# Patient Record
Sex: Male | Born: 1945 | Race: White | Hispanic: No | Marital: Married | Smoking: Never smoker
Health system: Southern US, Community
[De-identification: ages and names within clinical notes are randomized; demographics above are authoritative.]

## PROBLEM LIST (undated history)

## (undated) DIAGNOSIS — N189 Chronic kidney disease, unspecified: Secondary | ICD-10-CM

## (undated) DIAGNOSIS — M1990 Unspecified osteoarthritis, unspecified site: Secondary | ICD-10-CM

## (undated) DIAGNOSIS — E119 Type 2 diabetes mellitus without complications: Secondary | ICD-10-CM

## (undated) DIAGNOSIS — I10 Essential (primary) hypertension: Secondary | ICD-10-CM

## (undated) HISTORY — PX: COLONOSCOPY: SHX174

## (undated) HISTORY — PX: CATARACT EXTRACTION, BILATERAL: SHX1313

## (undated) HISTORY — DX: Essential (primary) hypertension: I10

## (undated) HISTORY — PX: APPENDECTOMY: SHX54

---

## 2013-06-08 DIAGNOSIS — I10 Essential (primary) hypertension: Secondary | ICD-10-CM | POA: Diagnosis not present

## 2013-06-08 DIAGNOSIS — Z1331 Encounter for screening for depression: Secondary | ICD-10-CM | POA: Diagnosis not present

## 2013-06-08 DIAGNOSIS — E785 Hyperlipidemia, unspecified: Secondary | ICD-10-CM | POA: Diagnosis not present

## 2013-06-08 DIAGNOSIS — M25569 Pain in unspecified knee: Secondary | ICD-10-CM | POA: Diagnosis not present

## 2013-06-08 DIAGNOSIS — Z6833 Body mass index (BMI) 33.0-33.9, adult: Secondary | ICD-10-CM | POA: Diagnosis not present

## 2013-08-18 DIAGNOSIS — I10 Essential (primary) hypertension: Secondary | ICD-10-CM | POA: Diagnosis not present

## 2013-08-18 DIAGNOSIS — Z125 Encounter for screening for malignant neoplasm of prostate: Secondary | ICD-10-CM | POA: Diagnosis not present

## 2013-08-18 DIAGNOSIS — E785 Hyperlipidemia, unspecified: Secondary | ICD-10-CM | POA: Diagnosis not present

## 2013-08-25 DIAGNOSIS — Z23 Encounter for immunization: Secondary | ICD-10-CM | POA: Diagnosis not present

## 2013-08-25 DIAGNOSIS — IMO0002 Reserved for concepts with insufficient information to code with codable children: Secondary | ICD-10-CM | POA: Diagnosis not present

## 2013-08-25 DIAGNOSIS — Z6834 Body mass index (BMI) 34.0-34.9, adult: Secondary | ICD-10-CM | POA: Diagnosis not present

## 2013-08-25 DIAGNOSIS — E785 Hyperlipidemia, unspecified: Secondary | ICD-10-CM | POA: Diagnosis not present

## 2013-08-25 DIAGNOSIS — Z0000 Encounter for general adult medical examination without abnormal findings: Secondary | ICD-10-CM | POA: Diagnosis not present

## 2013-08-25 DIAGNOSIS — I10 Essential (primary) hypertension: Secondary | ICD-10-CM | POA: Diagnosis not present

## 2013-08-25 DIAGNOSIS — Z125 Encounter for screening for malignant neoplasm of prostate: Secondary | ICD-10-CM | POA: Diagnosis not present

## 2013-08-29 DIAGNOSIS — Z1212 Encounter for screening for malignant neoplasm of rectum: Secondary | ICD-10-CM | POA: Diagnosis not present

## 2014-08-23 DIAGNOSIS — I10 Essential (primary) hypertension: Secondary | ICD-10-CM | POA: Diagnosis not present

## 2014-08-23 DIAGNOSIS — Z125 Encounter for screening for malignant neoplasm of prostate: Secondary | ICD-10-CM | POA: Diagnosis not present

## 2014-08-23 DIAGNOSIS — E785 Hyperlipidemia, unspecified: Secondary | ICD-10-CM | POA: Diagnosis not present

## 2014-08-30 DIAGNOSIS — L989 Disorder of the skin and subcutaneous tissue, unspecified: Secondary | ICD-10-CM | POA: Diagnosis not present

## 2014-08-30 DIAGNOSIS — E785 Hyperlipidemia, unspecified: Secondary | ICD-10-CM | POA: Diagnosis not present

## 2014-08-30 DIAGNOSIS — Z1212 Encounter for screening for malignant neoplasm of rectum: Secondary | ICD-10-CM | POA: Diagnosis not present

## 2014-08-30 DIAGNOSIS — I10 Essential (primary) hypertension: Secondary | ICD-10-CM | POA: Diagnosis not present

## 2014-08-30 DIAGNOSIS — Z23 Encounter for immunization: Secondary | ICD-10-CM | POA: Diagnosis not present

## 2014-08-30 DIAGNOSIS — Z0000 Encounter for general adult medical examination without abnormal findings: Secondary | ICD-10-CM | POA: Diagnosis not present

## 2014-08-30 DIAGNOSIS — Z6834 Body mass index (BMI) 34.0-34.9, adult: Secondary | ICD-10-CM | POA: Diagnosis not present

## 2014-09-01 ENCOUNTER — Ambulatory Visit (AMBULATORY_SURGERY_CENTER): Payer: Self-pay

## 2014-09-01 VITALS — Ht 69.0 in | Wt 234.6 lb

## 2014-09-01 DIAGNOSIS — Z1211 Encounter for screening for malignant neoplasm of colon: Secondary | ICD-10-CM

## 2014-09-01 MED ORDER — MOVIPREP 100 G PO SOLR: 1.0000 | Freq: Once | ORAL | Status: DC

## 2014-09-01 NOTE — Progress Notes (Signed)
No allergies to eggs or soy No diet/weight loss meds No home oxygen No past problems with anesthesia  Has email  Emmi instructions given for colonoscopy 

## 2014-09-13 ENCOUNTER — Encounter: Payer: Self-pay | Admitting: Internal Medicine

## 2014-09-25 ENCOUNTER — Ambulatory Visit (AMBULATORY_SURGERY_CENTER): Payer: Medicare Other | Admitting: Internal Medicine

## 2014-09-25 ENCOUNTER — Encounter: Payer: Self-pay | Admitting: Internal Medicine

## 2014-09-25 VITALS — BP 137/81 | HR 79 | Temp 97.2°F | Resp 19 | Ht 69.0 in | Wt 234.0 lb

## 2014-09-25 DIAGNOSIS — Z1211 Encounter for screening for malignant neoplasm of colon: Secondary | ICD-10-CM

## 2014-09-25 MED ORDER — SODIUM CHLORIDE 0.9 % IV SOLN: 500.0000 mL | INTRAVENOUS | Status: DC

## 2014-09-25 NOTE — Patient Instructions (Signed)
Discharge instructions given. Normal exam. Resume previous medications. YOU HAD AN ENDOSCOPIC PROCEDURE TODAY AT THE [facility]:   Refer to the procedure report that was given to you for any specific questions about what was found during the examination.  If the procedure report does not answer your questions, please call your gastroenterologist to clarify.  If you requested that your care partner not be given the details of your procedure findings, then the procedure report has been included in a sealed envelope for you to review at your convenience later.  YOU SHOULD EXPECT: Some feelings of bloating in the abdomen. Passage of more gas than usual.  Walking can help get rid of the air that was put into your GI tract during the procedure and reduce the bloating. If you had a lower endoscopy (such as a colonoscopy or flexible sigmoidoscopy) you may notice spotting of blood in your stool or on the toilet paper. If you underwent a bowel prep for your procedure, you may not have a normal bowel movement for a few days.  Please Note:  You might notice some irritation and congestion in your nose or some drainage.  This is from the oxygen used during your procedure.  There is no need for concern and it should clear up in a day or so.  SYMPTOMS TO REPORT IMMEDIATELY:   Following lower endoscopy (colonoscopy or flexible sigmoidoscopy):  Excessive amounts of blood in the stool  Significant tenderness or worsening of abdominal pains  Swelling of the abdomen that is new, acute  Fever of 100F or higher   For urgent or emergent issues, a gastroenterologist can be reached at any hour by calling [phone].   DIET: Your first meal following the procedure should be a small meal and then it is ok to progress to your normal diet. Heavy or fried foods are harder to digest and may make you feel nauseous or bloated.  Likewise, meals heavy in dairy and vegetables can increase bloating.  Drink plenty  of fluids but you should avoid alcoholic beverages for 24 hours.  ACTIVITY:  You should plan to take it easy for the rest of today and you should NOT DRIVE or use heavy machinery until tomorrow (because of the sedation medicines used during the test).    FOLLOW UP: Our staff will call the number listed on your records the next business day following your procedure to check on you and address any questions or concerns that you may have regarding the information given to you following your procedure. If we do not reach you, we will leave a message.  However, if you are feeling well and you are not experiencing any problems, there is no need to return our call.  We will assume that you have returned to your regular daily activities without incident.  If any biopsies were taken you will be contacted by phone or by letter within the next 1-3 weeks.  Please call us at [phone] if you have not heard about the biopsies in 3 weeks.    SIGNATURES/CONFIDENTIALITY: You and/or your care partner have signed paperwork which will be entered into your electronic medical record.  These signatures attest to the fact that that the information above on your After Visit Summary has been reviewed and is understood.  Full responsibility of the confidentiality of this discharge information lies with you and/or your care-partner. 

## 2014-09-25 NOTE — Op Note (Signed)
[facility]  [address] [place], [address]   COLONOSCOPY PROCEDURE REPORT  PATIENT: Jonathan Jimenez, Jonathan Jimenez  MR#: [id number] BIRTHDATE: [birth year] , 48  yrs. old GENDER: male ENDOSCOPIST: Eustace Quail, MD REFERRED XE:NMMHW Ardeth Perfect, M.D. PROCEDURE DATE:  09/25/2014 PROCEDURE:   Colonoscopy, screening First Screening Colonoscopy - Avg.  risk and is 50 yrs.  old or older Yes.  Prior Negative Screening - Now for repeat screening. N/A  History of Adenoma - Now for follow-up colonoscopy & has been > or = to 3 yrs.  N/A ASA CLASS:   Class II INDICATIONS:Screening for colonic neoplasia and Colorectal Neoplasm Risk Assessment for this procedure is average risk. MEDICATIONS: Monitored anesthesia care, Propofol 200 mg IV, and Lidocaine 200 mg IV  DESCRIPTION OF PROCEDURE:   After the risks benefits and alternatives of the procedure were thoroughly explained, informed consent was obtained.  The digital rectal exam revealed no abnormalities of the rectum.   The LB [id number]  endoscope was introduced through the anus and advanced to the cecum, which was identified by both the appendix and ileocecal valve. No adverse events experienced.   The quality of the prep was excellent. (MoviPrep was used)  The instrument was then slowly withdrawn as the colon was fully examined.      COLON FINDINGS: A normal appearing cecum, ileocecal valve, and appendiceal orifice were identified.  The ascending, transverse, descending, sigmoid colon, and rectum appeared unremarkable. Retroflexed views revealed internal hemorrhoids. The time to cecum = 2.5 Withdrawal time = 7.6   The scope was withdrawn and the procedure completed.  COMPLICATIONS: There were no immediate complications.  ENDOSCOPIC IMPRESSION: Normal colonoscopy  RECOMMENDATIONS: Continue current colorectal screening recommendations for "routine risk" patients with a repeat colonoscopy in 10 years.  eSigned:  Eustace Quail, MD 09/25/2014 10:13 AM   cc: The Patient    ; Ermalinda Memos

## 2014-09-25 NOTE — Progress Notes (Signed)
Report to PACU, RN, vss, BBS= Clear.  

## 2014-09-26 ENCOUNTER — Telehealth: Payer: Self-pay | Admitting: *Deleted

## 2014-09-26 NOTE — Telephone Encounter (Signed)
  Follow up Call-  Call back number 09/25/2014  Post procedure Call Back phone  # [phone]  Permission to leave phone message Yes     Patient questions:  Do you have a fever, pain , or abdominal swelling? No. Pain Score  0 *  Have you tolerated food without any problems? Yes.    Have you been able to return to your normal activities? Yes.    Do you have any questions about your discharge instructions: Diet   No. Medications  No. Follow up visit  No.  Do you have questions or concerns about your Care? No.  Actions: * If pain score is 4 or above: No action needed, pain <4.

## 2015-01-02 DIAGNOSIS — H3531 Nonexudative age-related macular degeneration: Secondary | ICD-10-CM | POA: Diagnosis not present

## 2015-08-31 DIAGNOSIS — I10 Essential (primary) hypertension: Secondary | ICD-10-CM | POA: Diagnosis not present

## 2015-08-31 DIAGNOSIS — Z125 Encounter for screening for malignant neoplasm of prostate: Secondary | ICD-10-CM | POA: Diagnosis not present

## 2015-08-31 DIAGNOSIS — E784 Other hyperlipidemia: Secondary | ICD-10-CM | POA: Diagnosis not present

## 2015-09-07 DIAGNOSIS — Z1389 Encounter for screening for other disorder: Secondary | ICD-10-CM | POA: Diagnosis not present

## 2015-09-07 DIAGNOSIS — I10 Essential (primary) hypertension: Secondary | ICD-10-CM | POA: Diagnosis not present

## 2015-09-07 DIAGNOSIS — R509 Fever, unspecified: Secondary | ICD-10-CM | POA: Diagnosis not present

## 2015-09-07 DIAGNOSIS — R972 Elevated prostate specific antigen [PSA]: Secondary | ICD-10-CM | POA: Diagnosis not present

## 2015-09-07 DIAGNOSIS — N183 Chronic kidney disease, stage 3 (moderate): Secondary | ICD-10-CM | POA: Diagnosis not present

## 2015-09-07 DIAGNOSIS — Z0000 Encounter for general adult medical examination without abnormal findings: Secondary | ICD-10-CM | POA: Diagnosis not present

## 2015-09-07 DIAGNOSIS — E784 Other hyperlipidemia: Secondary | ICD-10-CM | POA: Diagnosis not present

## 2015-09-07 DIAGNOSIS — Z6834 Body mass index (BMI) 34.0-34.9, adult: Secondary | ICD-10-CM | POA: Diagnosis not present

## 2015-09-12 DIAGNOSIS — Z1212 Encounter for screening for malignant neoplasm of rectum: Secondary | ICD-10-CM | POA: Diagnosis not present

## 2015-09-19 DIAGNOSIS — I10 Essential (primary) hypertension: Secondary | ICD-10-CM | POA: Diagnosis not present

## 2015-09-19 DIAGNOSIS — N183 Chronic kidney disease, stage 3 (moderate): Secondary | ICD-10-CM | POA: Diagnosis not present

## 2015-09-19 DIAGNOSIS — R9721 Rising PSA following treatment for malignant neoplasm of prostate: Secondary | ICD-10-CM | POA: Diagnosis not present

## 2015-09-19 DIAGNOSIS — L239 Allergic contact dermatitis, unspecified cause: Secondary | ICD-10-CM | POA: Diagnosis not present

## 2015-09-19 DIAGNOSIS — Z6834 Body mass index (BMI) 34.0-34.9, adult: Secondary | ICD-10-CM | POA: Diagnosis not present

## 2015-09-19 DIAGNOSIS — R112 Nausea with vomiting, unspecified: Secondary | ICD-10-CM | POA: Diagnosis not present

## 2015-09-26 DIAGNOSIS — R972 Elevated prostate specific antigen [PSA]: Secondary | ICD-10-CM | POA: Diagnosis not present

## 2015-09-26 DIAGNOSIS — N183 Chronic kidney disease, stage 3 (moderate): Secondary | ICD-10-CM | POA: Diagnosis not present

## 2015-09-26 DIAGNOSIS — I10 Essential (primary) hypertension: Secondary | ICD-10-CM | POA: Diagnosis not present

## 2015-09-26 DIAGNOSIS — Z6834 Body mass index (BMI) 34.0-34.9, adult: Secondary | ICD-10-CM | POA: Diagnosis not present

## 2015-10-01 DIAGNOSIS — R350 Frequency of micturition: Secondary | ICD-10-CM | POA: Diagnosis not present

## 2015-10-01 DIAGNOSIS — R972 Elevated prostate specific antigen [PSA]: Secondary | ICD-10-CM | POA: Diagnosis not present

## 2015-10-01 DIAGNOSIS — Z0000 Encounter for general adult medical examination without abnormal findings: Secondary | ICD-10-CM | POA: Diagnosis not present

## 2015-10-01 DIAGNOSIS — N401 Enlarged prostate with lower urinary tract symptoms: Secondary | ICD-10-CM | POA: Diagnosis not present

## 2015-10-18 DIAGNOSIS — R350 Frequency of micturition: Secondary | ICD-10-CM | POA: Diagnosis not present

## 2015-12-14 DIAGNOSIS — R972 Elevated prostate specific antigen [PSA]: Secondary | ICD-10-CM | POA: Diagnosis not present

## 2016-01-17 DIAGNOSIS — N401 Enlarged prostate with lower urinary tract symptoms: Secondary | ICD-10-CM | POA: Diagnosis not present

## 2016-01-17 DIAGNOSIS — R350 Frequency of micturition: Secondary | ICD-10-CM | POA: Diagnosis not present

## 2016-01-17 DIAGNOSIS — R972 Elevated prostate specific antigen [PSA]: Secondary | ICD-10-CM | POA: Diagnosis not present

## 2016-06-03 DIAGNOSIS — H25013 Cortical age-related cataract, bilateral: Secondary | ICD-10-CM | POA: Diagnosis not present

## 2016-06-03 DIAGNOSIS — H2513 Age-related nuclear cataract, bilateral: Secondary | ICD-10-CM | POA: Diagnosis not present

## 2016-06-03 DIAGNOSIS — H353132 Nonexudative age-related macular degeneration, bilateral, intermediate dry stage: Secondary | ICD-10-CM | POA: Diagnosis not present

## 2016-07-10 DIAGNOSIS — R972 Elevated prostate specific antigen [PSA]: Secondary | ICD-10-CM | POA: Diagnosis not present

## 2016-07-17 DIAGNOSIS — R972 Elevated prostate specific antigen [PSA]: Secondary | ICD-10-CM | POA: Diagnosis not present

## 2016-07-17 DIAGNOSIS — N400 Enlarged prostate without lower urinary tract symptoms: Secondary | ICD-10-CM | POA: Diagnosis not present

## 2016-09-03 DIAGNOSIS — N183 Chronic kidney disease, stage 3 (moderate): Secondary | ICD-10-CM | POA: Diagnosis not present

## 2016-09-03 DIAGNOSIS — R7309 Other abnormal glucose: Secondary | ICD-10-CM | POA: Diagnosis not present

## 2016-09-03 DIAGNOSIS — Z125 Encounter for screening for malignant neoplasm of prostate: Secondary | ICD-10-CM | POA: Diagnosis not present

## 2016-09-03 DIAGNOSIS — E784 Other hyperlipidemia: Secondary | ICD-10-CM | POA: Diagnosis not present

## 2016-09-10 DIAGNOSIS — Z1389 Encounter for screening for other disorder: Secondary | ICD-10-CM | POA: Diagnosis not present

## 2016-09-10 DIAGNOSIS — E784 Other hyperlipidemia: Secondary | ICD-10-CM | POA: Diagnosis not present

## 2016-09-10 DIAGNOSIS — I10 Essential (primary) hypertension: Secondary | ICD-10-CM | POA: Diagnosis not present

## 2016-09-10 DIAGNOSIS — N183 Chronic kidney disease, stage 3 (moderate): Secondary | ICD-10-CM | POA: Diagnosis not present

## 2016-09-10 DIAGNOSIS — R972 Elevated prostate specific antigen [PSA]: Secondary | ICD-10-CM | POA: Diagnosis not present

## 2016-09-10 DIAGNOSIS — Z0000 Encounter for general adult medical examination without abnormal findings: Secondary | ICD-10-CM | POA: Diagnosis not present

## 2016-09-10 DIAGNOSIS — Z6836 Body mass index (BMI) 36.0-36.9, adult: Secondary | ICD-10-CM | POA: Diagnosis not present

## 2016-09-10 DIAGNOSIS — M179 Osteoarthritis of knee, unspecified: Secondary | ICD-10-CM | POA: Diagnosis not present

## 2016-09-11 DIAGNOSIS — Z1212 Encounter for screening for malignant neoplasm of rectum: Secondary | ICD-10-CM | POA: Diagnosis not present

## 2016-10-07 DIAGNOSIS — R3915 Urgency of urination: Secondary | ICD-10-CM | POA: Diagnosis not present

## 2016-10-07 DIAGNOSIS — M13161 Monoarthritis, not elsewhere classified, right knee: Secondary | ICD-10-CM | POA: Diagnosis not present

## 2016-10-07 DIAGNOSIS — Z79899 Other long term (current) drug therapy: Secondary | ICD-10-CM | POA: Diagnosis not present

## 2016-10-07 DIAGNOSIS — E669 Obesity, unspecified: Secondary | ICD-10-CM | POA: Diagnosis not present

## 2016-10-07 DIAGNOSIS — Z0000 Encounter for general adult medical examination without abnormal findings: Secondary | ICD-10-CM | POA: Diagnosis not present

## 2016-10-07 DIAGNOSIS — M13162 Monoarthritis, not elsewhere classified, left knee: Secondary | ICD-10-CM | POA: Diagnosis not present

## 2016-10-07 DIAGNOSIS — Z6837 Body mass index (BMI) 37.0-37.9, adult: Secondary | ICD-10-CM | POA: Diagnosis not present

## 2016-10-07 DIAGNOSIS — H3530 Unspecified macular degeneration: Secondary | ICD-10-CM | POA: Diagnosis not present

## 2016-10-07 DIAGNOSIS — E785 Hyperlipidemia, unspecified: Secondary | ICD-10-CM | POA: Diagnosis not present

## 2016-10-07 DIAGNOSIS — I10 Essential (primary) hypertension: Secondary | ICD-10-CM | POA: Diagnosis not present

## 2016-10-22 DIAGNOSIS — M25562 Pain in left knee: Secondary | ICD-10-CM | POA: Diagnosis not present

## 2016-10-22 DIAGNOSIS — G8929 Other chronic pain: Secondary | ICD-10-CM | POA: Diagnosis not present

## 2016-10-22 DIAGNOSIS — M170 Bilateral primary osteoarthritis of knee: Secondary | ICD-10-CM | POA: Diagnosis not present

## 2016-11-25 DIAGNOSIS — M25561 Pain in right knee: Secondary | ICD-10-CM | POA: Diagnosis not present

## 2016-11-25 DIAGNOSIS — M170 Bilateral primary osteoarthritis of knee: Secondary | ICD-10-CM | POA: Diagnosis not present

## 2016-11-25 DIAGNOSIS — G8929 Other chronic pain: Secondary | ICD-10-CM | POA: Diagnosis not present

## 2016-11-25 DIAGNOSIS — M25562 Pain in left knee: Secondary | ICD-10-CM | POA: Diagnosis not present

## 2017-04-22 DIAGNOSIS — R972 Elevated prostate specific antigen [PSA]: Secondary | ICD-10-CM | POA: Diagnosis not present

## 2017-04-29 DIAGNOSIS — N401 Enlarged prostate with lower urinary tract symptoms: Secondary | ICD-10-CM | POA: Diagnosis not present

## 2017-04-29 DIAGNOSIS — R3915 Urgency of urination: Secondary | ICD-10-CM | POA: Diagnosis not present

## 2017-04-29 DIAGNOSIS — R972 Elevated prostate specific antigen [PSA]: Secondary | ICD-10-CM | POA: Diagnosis not present

## 2017-06-04 DIAGNOSIS — H25013 Cortical age-related cataract, bilateral: Secondary | ICD-10-CM | POA: Diagnosis not present

## 2017-06-04 DIAGNOSIS — R69 Illness, unspecified: Secondary | ICD-10-CM | POA: Diagnosis not present

## 2017-06-04 DIAGNOSIS — H2513 Age-related nuclear cataract, bilateral: Secondary | ICD-10-CM | POA: Diagnosis not present

## 2017-06-04 DIAGNOSIS — H353132 Nonexudative age-related macular degeneration, bilateral, intermediate dry stage: Secondary | ICD-10-CM | POA: Diagnosis not present

## 2017-09-08 DIAGNOSIS — Z125 Encounter for screening for malignant neoplasm of prostate: Secondary | ICD-10-CM | POA: Diagnosis not present

## 2017-09-08 DIAGNOSIS — E785 Hyperlipidemia, unspecified: Secondary | ICD-10-CM | POA: Diagnosis not present

## 2017-09-08 DIAGNOSIS — R82998 Other abnormal findings in urine: Secondary | ICD-10-CM | POA: Diagnosis not present

## 2017-09-08 DIAGNOSIS — R739 Hyperglycemia, unspecified: Secondary | ICD-10-CM | POA: Diagnosis not present

## 2017-09-08 DIAGNOSIS — I10 Essential (primary) hypertension: Secondary | ICD-10-CM | POA: Diagnosis not present

## 2017-09-15 DIAGNOSIS — M1711 Unilateral primary osteoarthritis, right knee: Secondary | ICD-10-CM | POA: Diagnosis not present

## 2017-09-15 DIAGNOSIS — I10 Essential (primary) hypertension: Secondary | ICD-10-CM | POA: Diagnosis not present

## 2017-09-15 DIAGNOSIS — N183 Chronic kidney disease, stage 3 (moderate): Secondary | ICD-10-CM | POA: Diagnosis not present

## 2017-09-15 DIAGNOSIS — M1712 Unilateral primary osteoarthritis, left knee: Secondary | ICD-10-CM | POA: Diagnosis not present

## 2017-09-15 DIAGNOSIS — R972 Elevated prostate specific antigen [PSA]: Secondary | ICD-10-CM | POA: Diagnosis not present

## 2017-09-15 DIAGNOSIS — I129 Hypertensive chronic kidney disease with stage 1 through stage 4 chronic kidney disease, or unspecified chronic kidney disease: Secondary | ICD-10-CM | POA: Diagnosis not present

## 2017-09-15 DIAGNOSIS — E7849 Other hyperlipidemia: Secondary | ICD-10-CM | POA: Diagnosis not present

## 2017-09-15 DIAGNOSIS — K219 Gastro-esophageal reflux disease without esophagitis: Secondary | ICD-10-CM | POA: Diagnosis not present

## 2017-09-15 DIAGNOSIS — Z0000 Encounter for general adult medical examination without abnormal findings: Secondary | ICD-10-CM | POA: Diagnosis not present

## 2017-09-15 DIAGNOSIS — N401 Enlarged prostate with lower urinary tract symptoms: Secondary | ICD-10-CM | POA: Diagnosis not present

## 2017-09-22 DIAGNOSIS — Z1212 Encounter for screening for malignant neoplasm of rectum: Secondary | ICD-10-CM | POA: Diagnosis not present

## 2017-09-24 DIAGNOSIS — R69 Illness, unspecified: Secondary | ICD-10-CM | POA: Diagnosis not present

## 2017-10-06 DIAGNOSIS — R69 Illness, unspecified: Secondary | ICD-10-CM | POA: Diagnosis not present

## 2017-12-08 DIAGNOSIS — H353131 Nonexudative age-related macular degeneration, bilateral, early dry stage: Secondary | ICD-10-CM | POA: Diagnosis not present

## 2018-01-06 DIAGNOSIS — X32XXXA Exposure to sunlight, initial encounter: Secondary | ICD-10-CM | POA: Diagnosis not present

## 2018-01-06 DIAGNOSIS — L570 Actinic keratosis: Secondary | ICD-10-CM | POA: Diagnosis not present

## 2018-01-06 DIAGNOSIS — D225 Melanocytic nevi of trunk: Secondary | ICD-10-CM | POA: Diagnosis not present

## 2018-03-09 DIAGNOSIS — N401 Enlarged prostate with lower urinary tract symptoms: Secondary | ICD-10-CM | POA: Diagnosis not present

## 2018-03-09 DIAGNOSIS — R3915 Urgency of urination: Secondary | ICD-10-CM | POA: Diagnosis not present

## 2018-03-09 DIAGNOSIS — R972 Elevated prostate specific antigen [PSA]: Secondary | ICD-10-CM | POA: Diagnosis not present

## 2018-05-04 DIAGNOSIS — R972 Elevated prostate specific antigen [PSA]: Secondary | ICD-10-CM | POA: Diagnosis not present

## 2018-06-15 DIAGNOSIS — H3530 Unspecified macular degeneration: Secondary | ICD-10-CM | POA: Diagnosis not present

## 2018-06-15 DIAGNOSIS — H35341 Macular cyst, hole, or pseudohole, right eye: Secondary | ICD-10-CM | POA: Diagnosis not present

## 2018-08-10 DIAGNOSIS — R972 Elevated prostate specific antigen [PSA]: Secondary | ICD-10-CM | POA: Diagnosis not present

## 2018-09-14 DIAGNOSIS — Z125 Encounter for screening for malignant neoplasm of prostate: Secondary | ICD-10-CM | POA: Diagnosis not present

## 2018-09-14 DIAGNOSIS — R82998 Other abnormal findings in urine: Secondary | ICD-10-CM | POA: Diagnosis not present

## 2018-09-14 DIAGNOSIS — I10 Essential (primary) hypertension: Secondary | ICD-10-CM | POA: Diagnosis not present

## 2018-09-14 DIAGNOSIS — R7301 Impaired fasting glucose: Secondary | ICD-10-CM | POA: Diagnosis not present

## 2018-09-14 DIAGNOSIS — E7849 Other hyperlipidemia: Secondary | ICD-10-CM | POA: Diagnosis not present

## 2018-09-21 DIAGNOSIS — R972 Elevated prostate specific antigen [PSA]: Secondary | ICD-10-CM | POA: Diagnosis not present

## 2018-09-21 DIAGNOSIS — R739 Hyperglycemia, unspecified: Secondary | ICD-10-CM | POA: Diagnosis not present

## 2018-09-21 DIAGNOSIS — M179 Osteoarthritis of knee, unspecified: Secondary | ICD-10-CM | POA: Diagnosis not present

## 2018-09-21 DIAGNOSIS — N401 Enlarged prostate with lower urinary tract symptoms: Secondary | ICD-10-CM | POA: Diagnosis not present

## 2018-09-21 DIAGNOSIS — Z1331 Encounter for screening for depression: Secondary | ICD-10-CM | POA: Diagnosis not present

## 2018-09-21 DIAGNOSIS — K219 Gastro-esophageal reflux disease without esophagitis: Secondary | ICD-10-CM | POA: Diagnosis not present

## 2018-09-21 DIAGNOSIS — I129 Hypertensive chronic kidney disease with stage 1 through stage 4 chronic kidney disease, or unspecified chronic kidney disease: Secondary | ICD-10-CM | POA: Diagnosis not present

## 2018-09-21 DIAGNOSIS — Z0000 Encounter for general adult medical examination without abnormal findings: Secondary | ICD-10-CM | POA: Diagnosis not present

## 2018-09-21 DIAGNOSIS — E785 Hyperlipidemia, unspecified: Secondary | ICD-10-CM | POA: Diagnosis not present

## 2018-09-21 DIAGNOSIS — N183 Chronic kidney disease, stage 3 (moderate): Secondary | ICD-10-CM | POA: Diagnosis not present

## 2018-10-07 DIAGNOSIS — N401 Enlarged prostate with lower urinary tract symptoms: Secondary | ICD-10-CM | POA: Diagnosis not present

## 2018-10-07 DIAGNOSIS — R3915 Urgency of urination: Secondary | ICD-10-CM | POA: Diagnosis not present

## 2018-10-07 DIAGNOSIS — R972 Elevated prostate specific antigen [PSA]: Secondary | ICD-10-CM | POA: Diagnosis not present

## 2018-10-29 DIAGNOSIS — R972 Elevated prostate specific antigen [PSA]: Secondary | ICD-10-CM | POA: Diagnosis not present

## 2018-10-29 DIAGNOSIS — C61 Malignant neoplasm of prostate: Secondary | ICD-10-CM | POA: Diagnosis not present

## 2019-02-12 DIAGNOSIS — Z23 Encounter for immunization: Secondary | ICD-10-CM | POA: Diagnosis not present

## 2019-04-12 DIAGNOSIS — R972 Elevated prostate specific antigen [PSA]: Secondary | ICD-10-CM | POA: Diagnosis not present

## 2019-04-19 DIAGNOSIS — N401 Enlarged prostate with lower urinary tract symptoms: Secondary | ICD-10-CM | POA: Diagnosis not present

## 2019-04-19 DIAGNOSIS — R3915 Urgency of urination: Secondary | ICD-10-CM | POA: Diagnosis not present

## 2019-04-19 DIAGNOSIS — C61 Malignant neoplasm of prostate: Secondary | ICD-10-CM | POA: Diagnosis not present

## 2019-06-16 DIAGNOSIS — H353131 Nonexudative age-related macular degeneration, bilateral, early dry stage: Secondary | ICD-10-CM | POA: Diagnosis not present

## 2019-07-13 DIAGNOSIS — C61 Malignant neoplasm of prostate: Secondary | ICD-10-CM | POA: Diagnosis not present

## 2019-07-20 DIAGNOSIS — N401 Enlarged prostate with lower urinary tract symptoms: Secondary | ICD-10-CM | POA: Diagnosis not present

## 2019-07-20 DIAGNOSIS — R3915 Urgency of urination: Secondary | ICD-10-CM | POA: Diagnosis not present

## 2019-07-20 DIAGNOSIS — C61 Malignant neoplasm of prostate: Secondary | ICD-10-CM | POA: Diagnosis not present

## 2019-07-22 ENCOUNTER — Other Ambulatory Visit: Payer: Self-pay | Admitting: Urology

## 2019-07-22 DIAGNOSIS — Z77018 Contact with and (suspected) exposure to other hazardous metals: Secondary | ICD-10-CM

## 2019-07-22 DIAGNOSIS — C61 Malignant neoplasm of prostate: Secondary | ICD-10-CM

## 2019-08-15 ENCOUNTER — Ambulatory Visit
Admission: RE | Admit: 2019-08-15 | Discharge: 2019-08-15 | Disposition: A | Discharge status: Home / Self Care | Payer: Managed Care, Other (non HMO) | Source: Ambulatory Visit | Attending: Urology | Admitting: Urology

## 2019-08-15 ENCOUNTER — Other Ambulatory Visit: Payer: Self-pay

## 2019-08-15 DIAGNOSIS — R972 Elevated prostate specific antigen [PSA]: Secondary | ICD-10-CM | POA: Diagnosis not present

## 2019-08-15 DIAGNOSIS — C61 Malignant neoplasm of prostate: Secondary | ICD-10-CM

## 2019-08-15 DIAGNOSIS — Z135 Encounter for screening for eye and ear disorders: Secondary | ICD-10-CM | POA: Diagnosis not present

## 2019-08-15 DIAGNOSIS — Z77018 Contact with and (suspected) exposure to other hazardous metals: Secondary | ICD-10-CM

## 2019-08-15 MED ORDER — GADOBENATE DIMEGLUMINE 529 MG/ML IV SOLN
20.0000 mL | Freq: Once | INTRAVENOUS | Status: AC | PRN
  Administered 2019-08-15: 20 mL via INTRAVENOUS

## 2019-10-04 DIAGNOSIS — R739 Hyperglycemia, unspecified: Secondary | ICD-10-CM | POA: Diagnosis not present

## 2019-10-04 DIAGNOSIS — I10 Essential (primary) hypertension: Secondary | ICD-10-CM | POA: Diagnosis not present

## 2019-10-04 DIAGNOSIS — E7849 Other hyperlipidemia: Secondary | ICD-10-CM | POA: Diagnosis not present

## 2019-10-04 DIAGNOSIS — Z125 Encounter for screening for malignant neoplasm of prostate: Secondary | ICD-10-CM | POA: Diagnosis not present

## 2019-10-04 DIAGNOSIS — Z0000 Encounter for general adult medical examination without abnormal findings: Secondary | ICD-10-CM | POA: Diagnosis not present

## 2019-10-11 DIAGNOSIS — M179 Osteoarthritis of knee, unspecified: Secondary | ICD-10-CM | POA: Diagnosis not present

## 2019-10-11 DIAGNOSIS — K219 Gastro-esophageal reflux disease without esophagitis: Secondary | ICD-10-CM | POA: Diagnosis not present

## 2019-10-11 DIAGNOSIS — N1831 Chronic kidney disease, stage 3a: Secondary | ICD-10-CM | POA: Diagnosis not present

## 2019-10-11 DIAGNOSIS — I10 Essential (primary) hypertension: Secondary | ICD-10-CM | POA: Diagnosis not present

## 2019-10-11 DIAGNOSIS — N401 Enlarged prostate with lower urinary tract symptoms: Secondary | ICD-10-CM | POA: Diagnosis not present

## 2019-10-11 DIAGNOSIS — R82998 Other abnormal findings in urine: Secondary | ICD-10-CM | POA: Diagnosis not present

## 2019-10-11 DIAGNOSIS — Z1331 Encounter for screening for depression: Secondary | ICD-10-CM | POA: Diagnosis not present

## 2019-10-11 DIAGNOSIS — R972 Elevated prostate specific antigen [PSA]: Secondary | ICD-10-CM | POA: Diagnosis not present

## 2019-10-11 DIAGNOSIS — E785 Hyperlipidemia, unspecified: Secondary | ICD-10-CM | POA: Diagnosis not present

## 2019-10-11 DIAGNOSIS — I129 Hypertensive chronic kidney disease with stage 1 through stage 4 chronic kidney disease, or unspecified chronic kidney disease: Secondary | ICD-10-CM | POA: Diagnosis not present

## 2019-10-11 DIAGNOSIS — Z0000 Encounter for general adult medical examination without abnormal findings: Secondary | ICD-10-CM | POA: Diagnosis not present

## 2019-10-13 DIAGNOSIS — Z1212 Encounter for screening for malignant neoplasm of rectum: Secondary | ICD-10-CM | POA: Diagnosis not present

## 2019-12-15 DIAGNOSIS — H353132 Nonexudative age-related macular degeneration, bilateral, intermediate dry stage: Secondary | ICD-10-CM | POA: Diagnosis not present

## 2020-03-06 DIAGNOSIS — H25043 Posterior subcapsular polar age-related cataract, bilateral: Secondary | ICD-10-CM | POA: Diagnosis not present

## 2020-03-06 DIAGNOSIS — H2513 Age-related nuclear cataract, bilateral: Secondary | ICD-10-CM | POA: Diagnosis not present

## 2020-03-06 DIAGNOSIS — H18413 Arcus senilis, bilateral: Secondary | ICD-10-CM | POA: Diagnosis not present

## 2020-03-06 DIAGNOSIS — H353131 Nonexudative age-related macular degeneration, bilateral, early dry stage: Secondary | ICD-10-CM | POA: Diagnosis not present

## 2020-03-06 DIAGNOSIS — H2511 Age-related nuclear cataract, right eye: Secondary | ICD-10-CM | POA: Diagnosis not present

## 2020-03-10 DIAGNOSIS — Z23 Encounter for immunization: Secondary | ICD-10-CM | POA: Diagnosis not present

## 2020-04-10 DIAGNOSIS — C61 Malignant neoplasm of prostate: Secondary | ICD-10-CM | POA: Diagnosis not present

## 2020-04-17 DIAGNOSIS — N401 Enlarged prostate with lower urinary tract symptoms: Secondary | ICD-10-CM | POA: Diagnosis not present

## 2020-04-17 DIAGNOSIS — C61 Malignant neoplasm of prostate: Secondary | ICD-10-CM | POA: Diagnosis not present

## 2020-04-17 DIAGNOSIS — R3915 Urgency of urination: Secondary | ICD-10-CM | POA: Diagnosis not present

## 2020-05-07 DIAGNOSIS — H2511 Age-related nuclear cataract, right eye: Secondary | ICD-10-CM | POA: Diagnosis not present

## 2020-05-08 DIAGNOSIS — H2512 Age-related nuclear cataract, left eye: Secondary | ICD-10-CM | POA: Diagnosis not present

## 2020-05-21 DIAGNOSIS — H2512 Age-related nuclear cataract, left eye: Secondary | ICD-10-CM | POA: Diagnosis not present

## 2020-06-08 DIAGNOSIS — M19011 Primary osteoarthritis, right shoulder: Secondary | ICD-10-CM | POA: Diagnosis not present

## 2020-06-08 DIAGNOSIS — M75111 Incomplete rotator cuff tear or rupture of right shoulder, not specified as traumatic: Secondary | ICD-10-CM | POA: Diagnosis not present

## 2020-06-25 DIAGNOSIS — Z0100 Encounter for examination of eyes and vision without abnormal findings: Secondary | ICD-10-CM | POA: Diagnosis not present

## 2020-07-24 DIAGNOSIS — H353132 Nonexudative age-related macular degeneration, bilateral, intermediate dry stage: Secondary | ICD-10-CM | POA: Diagnosis not present

## 2020-08-24 DIAGNOSIS — M170 Bilateral primary osteoarthritis of knee: Secondary | ICD-10-CM | POA: Diagnosis not present

## 2020-10-08 DIAGNOSIS — C61 Malignant neoplasm of prostate: Secondary | ICD-10-CM | POA: Diagnosis not present

## 2020-10-17 DIAGNOSIS — C61 Malignant neoplasm of prostate: Secondary | ICD-10-CM | POA: Diagnosis not present

## 2020-10-17 DIAGNOSIS — N401 Enlarged prostate with lower urinary tract symptoms: Secondary | ICD-10-CM | POA: Diagnosis not present

## 2020-10-17 DIAGNOSIS — R3915 Urgency of urination: Secondary | ICD-10-CM | POA: Diagnosis not present

## 2020-10-23 DIAGNOSIS — R7303 Prediabetes: Secondary | ICD-10-CM | POA: Diagnosis not present

## 2020-10-23 DIAGNOSIS — Z125 Encounter for screening for malignant neoplasm of prostate: Secondary | ICD-10-CM | POA: Diagnosis not present

## 2020-10-23 DIAGNOSIS — E785 Hyperlipidemia, unspecified: Secondary | ICD-10-CM | POA: Diagnosis not present

## 2020-10-30 DIAGNOSIS — I129 Hypertensive chronic kidney disease with stage 1 through stage 4 chronic kidney disease, or unspecified chronic kidney disease: Secondary | ICD-10-CM | POA: Diagnosis not present

## 2020-10-30 DIAGNOSIS — E785 Hyperlipidemia, unspecified: Secondary | ICD-10-CM | POA: Diagnosis not present

## 2020-10-30 DIAGNOSIS — N1831 Chronic kidney disease, stage 3a: Secondary | ICD-10-CM | POA: Diagnosis not present

## 2020-10-30 DIAGNOSIS — Z0000 Encounter for general adult medical examination without abnormal findings: Secondary | ICD-10-CM | POA: Diagnosis not present

## 2020-10-30 DIAGNOSIS — R82998 Other abnormal findings in urine: Secondary | ICD-10-CM | POA: Diagnosis not present

## 2020-10-30 DIAGNOSIS — Z1212 Encounter for screening for malignant neoplasm of rectum: Secondary | ICD-10-CM | POA: Diagnosis not present

## 2020-10-30 DIAGNOSIS — Z1339 Encounter for screening examination for other mental health and behavioral disorders: Secondary | ICD-10-CM | POA: Diagnosis not present

## 2020-10-30 DIAGNOSIS — Z1331 Encounter for screening for depression: Secondary | ICD-10-CM | POA: Diagnosis not present

## 2020-10-30 DIAGNOSIS — K219 Gastro-esophageal reflux disease without esophagitis: Secondary | ICD-10-CM | POA: Diagnosis not present

## 2021-01-17 DIAGNOSIS — H353132 Nonexudative age-related macular degeneration, bilateral, intermediate dry stage: Secondary | ICD-10-CM | POA: Diagnosis not present

## 2021-02-27 DIAGNOSIS — Z833 Family history of diabetes mellitus: Secondary | ICD-10-CM | POA: Diagnosis not present

## 2021-02-27 DIAGNOSIS — E669 Obesity, unspecified: Secondary | ICD-10-CM | POA: Diagnosis not present

## 2021-02-27 DIAGNOSIS — E785 Hyperlipidemia, unspecified: Secondary | ICD-10-CM | POA: Diagnosis not present

## 2021-02-27 DIAGNOSIS — Z008 Encounter for other general examination: Secondary | ICD-10-CM | POA: Diagnosis not present

## 2021-02-27 DIAGNOSIS — M1990 Unspecified osteoarthritis, unspecified site: Secondary | ICD-10-CM | POA: Diagnosis not present

## 2021-02-27 DIAGNOSIS — I10 Essential (primary) hypertension: Secondary | ICD-10-CM | POA: Diagnosis not present

## 2021-02-27 DIAGNOSIS — Z6833 Body mass index (BMI) 33.0-33.9, adult: Secondary | ICD-10-CM | POA: Diagnosis not present

## 2021-02-27 DIAGNOSIS — Z8249 Family history of ischemic heart disease and other diseases of the circulatory system: Secondary | ICD-10-CM | POA: Diagnosis not present

## 2021-03-09 DIAGNOSIS — Z23 Encounter for immunization: Secondary | ICD-10-CM | POA: Diagnosis not present

## 2021-04-11 DIAGNOSIS — C61 Malignant neoplasm of prostate: Secondary | ICD-10-CM | POA: Diagnosis not present

## 2021-04-18 DIAGNOSIS — C61 Malignant neoplasm of prostate: Secondary | ICD-10-CM | POA: Diagnosis not present

## 2021-04-18 DIAGNOSIS — R350 Frequency of micturition: Secondary | ICD-10-CM | POA: Diagnosis not present

## 2021-04-18 DIAGNOSIS — N401 Enlarged prostate with lower urinary tract symptoms: Secondary | ICD-10-CM | POA: Diagnosis not present

## 2021-04-19 DIAGNOSIS — L570 Actinic keratosis: Secondary | ICD-10-CM | POA: Diagnosis not present

## 2021-04-19 DIAGNOSIS — D225 Melanocytic nevi of trunk: Secondary | ICD-10-CM | POA: Diagnosis not present

## 2021-04-19 DIAGNOSIS — L821 Other seborrheic keratosis: Secondary | ICD-10-CM | POA: Diagnosis not present

## 2021-04-19 DIAGNOSIS — X32XXXD Exposure to sunlight, subsequent encounter: Secondary | ICD-10-CM | POA: Diagnosis not present

## 2021-07-16 DIAGNOSIS — H353132 Nonexudative age-related macular degeneration, bilateral, intermediate dry stage: Secondary | ICD-10-CM | POA: Diagnosis not present

## 2021-10-17 DIAGNOSIS — R3915 Urgency of urination: Secondary | ICD-10-CM | POA: Diagnosis not present

## 2021-10-17 DIAGNOSIS — C61 Malignant neoplasm of prostate: Secondary | ICD-10-CM | POA: Diagnosis not present

## 2021-10-17 DIAGNOSIS — N401 Enlarged prostate with lower urinary tract symptoms: Secondary | ICD-10-CM | POA: Diagnosis not present

## 2021-10-21 DIAGNOSIS — M170 Bilateral primary osteoarthritis of knee: Secondary | ICD-10-CM | POA: Diagnosis not present

## 2021-10-30 DIAGNOSIS — Z125 Encounter for screening for malignant neoplasm of prostate: Secondary | ICD-10-CM | POA: Diagnosis not present

## 2021-10-30 DIAGNOSIS — R7989 Other specified abnormal findings of blood chemistry: Secondary | ICD-10-CM | POA: Diagnosis not present

## 2021-10-30 DIAGNOSIS — I10 Essential (primary) hypertension: Secondary | ICD-10-CM | POA: Diagnosis not present

## 2021-10-30 DIAGNOSIS — E785 Hyperlipidemia, unspecified: Secondary | ICD-10-CM | POA: Diagnosis not present

## 2021-11-06 DIAGNOSIS — E785 Hyperlipidemia, unspecified: Secondary | ICD-10-CM | POA: Diagnosis not present

## 2021-11-06 DIAGNOSIS — Z1339 Encounter for screening examination for other mental health and behavioral disorders: Secondary | ICD-10-CM | POA: Diagnosis not present

## 2021-11-06 DIAGNOSIS — N401 Enlarged prostate with lower urinary tract symptoms: Secondary | ICD-10-CM | POA: Diagnosis not present

## 2021-11-06 DIAGNOSIS — Z1331 Encounter for screening for depression: Secondary | ICD-10-CM | POA: Diagnosis not present

## 2021-11-06 DIAGNOSIS — R7303 Prediabetes: Secondary | ICD-10-CM | POA: Diagnosis not present

## 2021-11-06 DIAGNOSIS — C61 Malignant neoplasm of prostate: Secondary | ICD-10-CM | POA: Diagnosis not present

## 2021-11-06 DIAGNOSIS — Z0000 Encounter for general adult medical examination without abnormal findings: Secondary | ICD-10-CM | POA: Diagnosis not present

## 2021-11-06 DIAGNOSIS — K219 Gastro-esophageal reflux disease without esophagitis: Secondary | ICD-10-CM | POA: Diagnosis not present

## 2021-11-06 DIAGNOSIS — I10 Essential (primary) hypertension: Secondary | ICD-10-CM | POA: Diagnosis not present

## 2021-11-06 DIAGNOSIS — N1831 Chronic kidney disease, stage 3a: Secondary | ICD-10-CM | POA: Diagnosis not present

## 2021-11-06 IMAGING — MR MR PROSTATE WO/W CM
56 series · 56 of 56 positions shown · IV contrast (17 ml multihance)
Comparison: None.

CLINICAL DATA: Elevated PSA. History of Gleason 6 disease on prior
biopsy

EXAM:
MR PROSTATE WITHOUT AND WITH CONTRAST
TECHNIQUE: Multiplanar multisequence MRI images were obtained of the pelvis
centered about the prostate. Pre and post contrast images were
obtained.
CONTRAST:  20mL MULTIHANCE GADOBENATE DIMEGLUMINE 529 MG/ML IV SOLN
Creatinine was obtained on site at [HOSPITAL] at [HOSPITAL].
Results: Creatinine 1.4 mg/dL.

[Series 4: bSSFP fat-sat · axial · 8.0mm · 0.74mm/px · 1 of 28 slices shown]
[im 1/28]
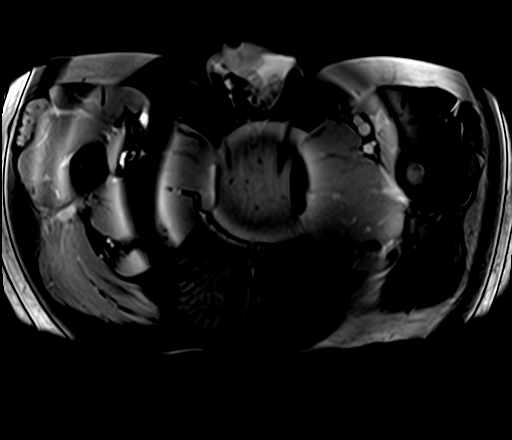

[Series 5: T1 · axial · 5.0mm · 1.25mm/px · 1 of 80 slices shown]
[im 1/80]
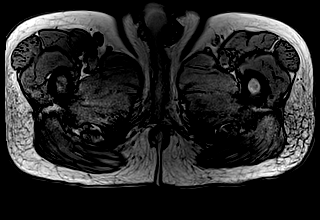

[Series 6: T2 · coronal · 3.5mm · 0.56mm/px · 1 of 23 slices shown (1 of 3)]
[im 1/23]
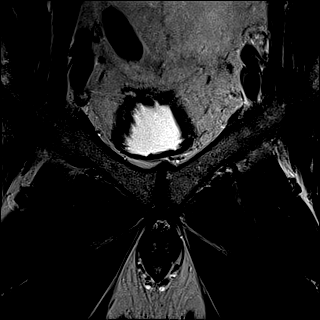

[Series 7: DWI · axial · 3.5mm · 1.75mm/px · 1 of 60 slices shown (1 of 3)]
[im 1/60]
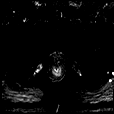

[Series 8: DWI · axial · 3.5mm · 1.75mm/px · 1 of 20 slices shown (2 of 3)]
[im 1/20]
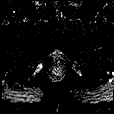

[Series 9: DWI · axial · 3.5mm · 1.56mm/px · 1 of 20 slices shown (3 of 3)]
[im 1/20]
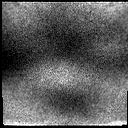

[Series 10: T2 · axial · 3.5mm · 0.56mm/px · 1 of 23 slices shown (2 of 3)]
[im 1/23]
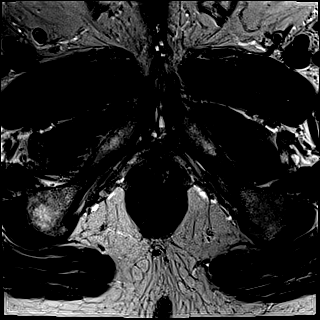

[Series 11: T2 · axial · 1.0mm · 1.04mm/px · 1 of 80 slices shown (3 of 3)]
[im 1/80]
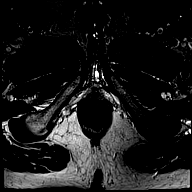

[Series 12: pre t1_twist_tra_dyn_ttc=5.3s · axial · non-contrast · 3.5mm · 0.83mm/px · 1 of 20 slices shown]
[im 1/20]
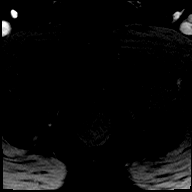

[Series 13: post t1_twist_tra_dyn-copy center · axial · 3.5mm · 0.83mm/px · 1 of 20 slices shown (1 of 24)]
[im 1/20]
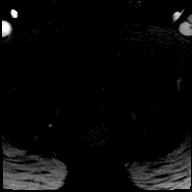

[Series 14: post t1_twist_tra_dyn-copy center · axial · 3.5mm · 0.83mm/px · 1 of 20 slices shown (2 of 24)]
[im 1/20]
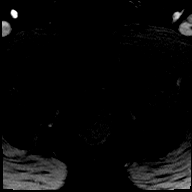

[Series 15: post t1_twist_tra_dyn-copy cent_sub_ttc=(id) · axial · 3.5mm · 0.83mm/px · 1 of 20 slices shown (1 of 23)]
[im 1/20]
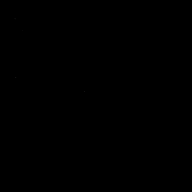

[Series 16: post t1_twist_tra_dyn-copy center · axial · 3.5mm · 0.83mm/px · 1 of 20 slices shown (3 of 24)]
[im 1/20]
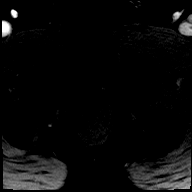

[Series 17: post t1_twist_tra_dyn-copy cent_sub_ttc=(id) · axial · 3.5mm · 0.83mm/px · 1 of 20 slices shown (2 of 23)]
[im 1/20]
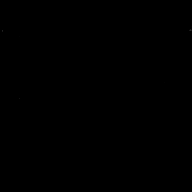

[Series 18: post t1_twist_tra_dyn-copy center · axial · 3.5mm · 0.83mm/px · 1 of 20 slices shown (4 of 24)]
[im 1/20]
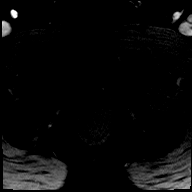

[Series 19: post t1_twist_tra_dyn-copy cent_sub_ttc=(id) · axial · 3.5mm · 0.83mm/px · 1 of 20 slices shown (3 of 23)]
[im 1/20]
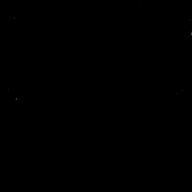

[Series 20: post t1_twist_tra_dyn-copy center · axial · 3.5mm · 0.83mm/px · 1 of 20 slices shown (5 of 24)]
[im 1/20]
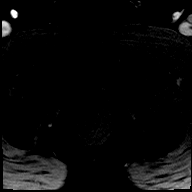

[Series 21: post t1_twist_tra_dyn-copy cent_sub_ttc=(id) · axial · 3.5mm · 0.83mm/px · 1 of 20 slices shown (4 of 23)]
[im 1/20]
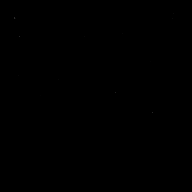

[Series 22: post t1_twist_tra_dyn-copy center · axial · 3.5mm · 0.83mm/px · 1 of 20 slices shown (6 of 24)]
[im 1/20]
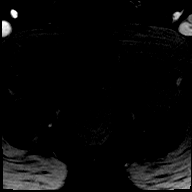

[Series 23: post t1_twist_tra_dyn-copy cent_sub_ttc=(id) · axial · 3.5mm · 0.83mm/px · 1 of 20 slices shown (5 of 23)]
[im 1/20]
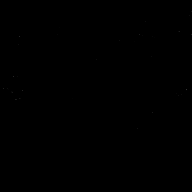

[Series 24: post t1_twist_tra_dyn-copy center · axial · 3.5mm · 0.83mm/px · 1 of 20 slices shown (7 of 24)]
[im 1/20]
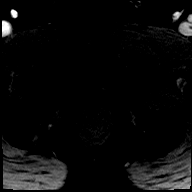

[Series 25: post t1_twist_tra_dyn-copy cent_sub_ttc=(id) · axial · 3.5mm · 0.83mm/px · 1 of 20 slices shown (6 of 23)]
[im 1/20]
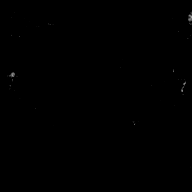

[Series 26: post t1_twist_tra_dyn-copy center · axial · 3.5mm · 0.83mm/px · 1 of 20 slices shown (8 of 24)]
[im 1/20]
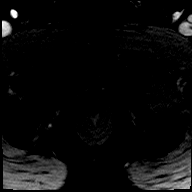

[Series 27: post t1_twist_tra_dyn-copy cent_sub_ttc=(id) · axial · 3.5mm · 0.83mm/px · 1 of 20 slices shown (7 of 23)]
[im 1/20]
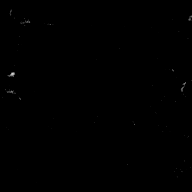

[Series 28: post t1_twist_tra_dyn-copy center · axial · 3.5mm · 0.83mm/px · 1 of 20 slices shown (9 of 24)]
[im 1/20]
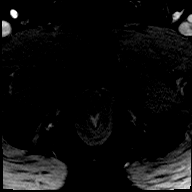

[Series 29: post t1_twist_tra_dyn-copy cent_sub_ttc=(id) · axial · 3.5mm · 0.83mm/px · 1 of 20 slices shown (8 of 23)]
[im 1/20]
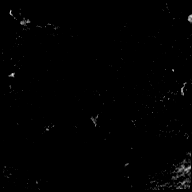

[Series 30: post t1_twist_tra_dyn-copy center · axial · 3.5mm · 0.83mm/px · 1 of 20 slices shown (10 of 24)]
[im 1/20]
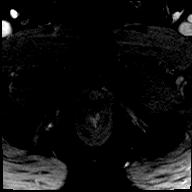

[Series 31: post t1_twist_tra_dyn-copy cent_sub_ttc=(id) · axial · 3.5mm · 0.83mm/px · 1 of 20 slices shown (9 of 23)]
[im 1/20]
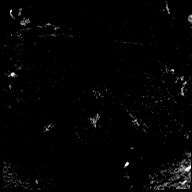

[Series 32: post t1_twist_tra_dyn-copy center · axial · 3.5mm · 0.83mm/px · 1 of 20 slices shown (11 of 24)]
[im 1/20]
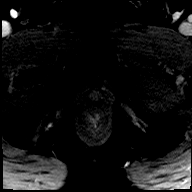

[Series 33: post t1_twist_tra_dyn-copy cent_sub_ttc=(id) · axial · 3.5mm · 0.83mm/px · 1 of 20 slices shown (10 of 23)]
[im 1/20]
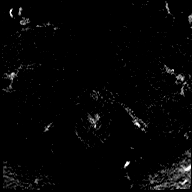

[Series 34: post t1_twist_tra_dyn-copy center · axial · 3.5mm · 0.83mm/px · 1 of 20 slices shown (12 of 24)]
[im 1/20]
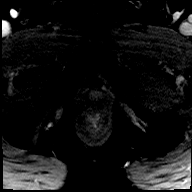

[Series 35: post t1_twist_tra_dyn-copy cent_sub_ttc=(id) · axial · 3.5mm · 0.83mm/px · 1 of 20 slices shown (11 of 23)]
[im 1/20]
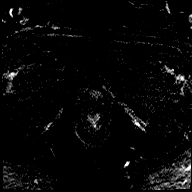

[Series 36: post t1_twist_tra_dyn-copy center · axial · 3.5mm · 0.83mm/px · 1 of 20 slices shown (13 of 24)]
[im 1/20]
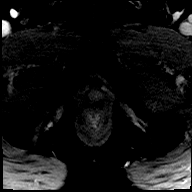

[Series 37: post t1_twist_tra_dyn-copy cent_sub_ttc=(id) · axial · 3.5mm · 0.83mm/px · 1 of 20 slices shown (12 of 23)]
[im 1/20]
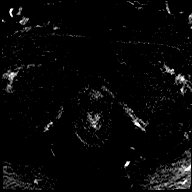

[Series 38: post t1_twist_tra_dyn-copy center · axial · 3.5mm · 0.83mm/px · 1 of 20 slices shown (14 of 24)]
[im 1/20]
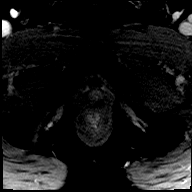

[Series 39: post t1_twist_tra_dyn-copy cent_sub_ttc=(id) · axial · 3.5mm · 0.83mm/px · 1 of 20 slices shown (13 of 23)]
[im 1/20]
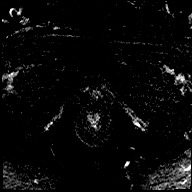

[Series 40: post t1_twist_tra_dyn-copy center · axial · 3.5mm · 0.83mm/px · 1 of 20 slices shown (15 of 24)]
[im 1/20]
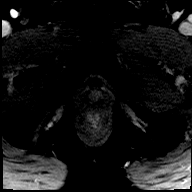

[Series 41: post t1_twist_tra_dyn-copy cent_sub_ttc=(id) · axial · 3.5mm · 0.83mm/px · 1 of 20 slices shown (14 of 23)]
[im 1/20]
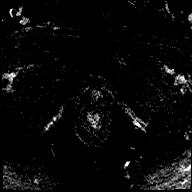

[Series 42: post t1_twist_tra_dyn-copy center · axial · 3.5mm · 0.83mm/px · 1 of 20 slices shown (16 of 24)]
[im 1/20]
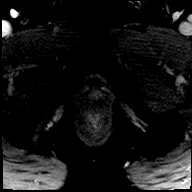

[Series 43: post t1_twist_tra_dyn-copy cent_sub_ttc=(id) · axial · 3.5mm · 0.83mm/px · 1 of 20 slices shown (15 of 23)]
[im 1/20]
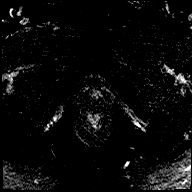

[Series 44: post t1_twist_tra_dyn-copy center · axial · 3.5mm · 0.83mm/px · 1 of 20 slices shown (17 of 24)]
[im 1/20]
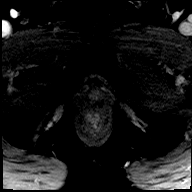

[Series 45: post t1_twist_tra_dyn-copy cent_sub_ttc=(id) · axial · 3.5mm · 0.83mm/px · 1 of 20 slices shown (16 of 23)]
[im 1/20]
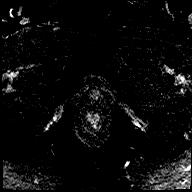

[Series 46: post t1_twist_tra_dyn-copy center · axial · 3.5mm · 0.83mm/px · 1 of 20 slices shown (18 of 24)]
[im 1/20]
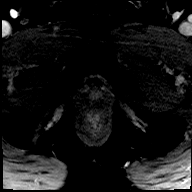

[Series 47: post t1_twist_tra_dyn-copy cent_sub_ttc=(id) · axial · 3.5mm · 0.83mm/px · 1 of 20 slices shown (17 of 23)]
[im 1/20]
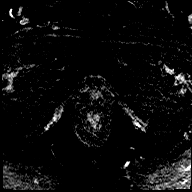

[Series 48: post t1_twist_tra_dyn-copy center · axial · 3.5mm · 0.83mm/px · 1 of 20 slices shown (19 of 24)]
[im 1/20]
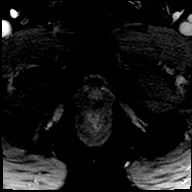

[Series 49: post t1_twist_tra_dyn-copy cent_sub_ttc=(id) · axial · 3.5mm · 0.83mm/px · 1 of 20 slices shown (18 of 23)]
[im 1/20]
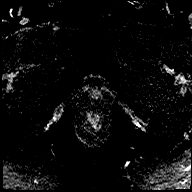

[Series 50: post t1_twist_tra_dyn-copy center · axial · 3.5mm · 0.83mm/px · 1 of 20 slices shown (20 of 24)]
[im 1/20]
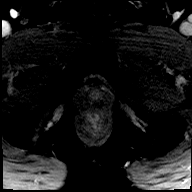

[Series 51: post t1_twist_tra_dyn-copy cent_sub_ttc=(id) · axial · 3.5mm · 0.83mm/px · 1 of 20 slices shown (19 of 23)]
[im 1/20]
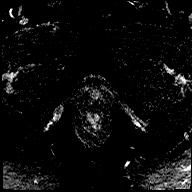

[Series 52: post t1_twist_tra_dyn-copy center · axial · 3.5mm · 0.83mm/px · 1 of 20 slices shown (21 of 24)]
[im 1/20]
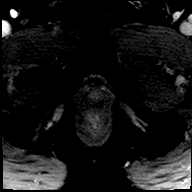

[Series 53: post t1_twist_tra_dyn-copy cent_sub_ttc=(id) · axial · 3.5mm · 0.83mm/px · 1 of 20 slices shown (20 of 23)]
[im 1/20]
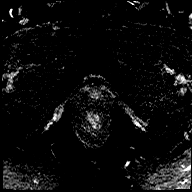

[Series 54: post t1_twist_tra_dyn-copy center · axial · 3.5mm · 0.83mm/px · 1 of 20 slices shown (22 of 24)]
[im 1/20]
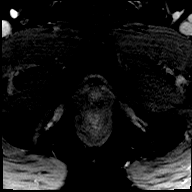

[Series 55: post t1_twist_tra_dyn-copy cent_sub_ttc=(id) · axial · 3.5mm · 0.83mm/px · 1 of 20 slices shown (21 of 23)]
[im 1/20]
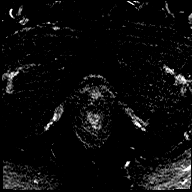

[Series 56: post t1_twist_tra_dyn-copy center · axial · 3.5mm · 0.83mm/px · 1 of 20 slices shown (23 of 24)]
[im 1/20]
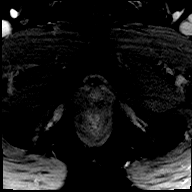

[Series 57: post t1_twist_tra_dyn-copy cent_sub_ttc=(id) · axial · 3.5mm · 0.83mm/px · 1 of 20 slices shown (22 of 23)]
[im 1/20]
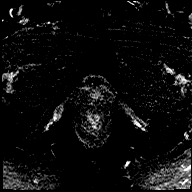

[Series 58: post t1_twist_tra_dyn-copy center · axial · 3.5mm · 0.83mm/px · 1 of 20 slices shown (24 of 24)]
[im 1/20]
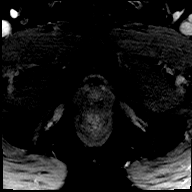

[Series 59: post t1_twist_tra_dyn-copy cent_sub_ttc=(id) · axial · 3.5mm · 0.83mm/px · 1 of 20 slices shown (23 of 23)]
[im 1/20]
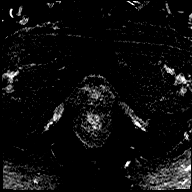

[56 of 56 positions shown; findings below may reference images not displayed]

FINDINGS: Prostate: Signs of BPH and sequela of prostatitis. No evidence of
high-risk lesion.

Volume: 5.2 x 5.8 x 4.2 (volume = 66 cc) cm

Transcapsular spread:  Absent

Seminal vesicle involvement: Absent

Neurovascular bundle involvement: Absent

Pelvic adenopathy: Absent

Bone metastasis: Absent

Other findings: Blood or proteinaceous material in the left seminal
vesicle. Small amount of high signal, intrinsic high signal in the
peripheral zone of the prostate may relate to prior biopsy there are
no priors for comparison
IMPRESSION: No evidence of high-grade prostate carcinoma.

Signs of BPH and sequela of prior prostatitis.

Small amount of blood and or proteinaceous material in the left
seminal vesicle.

Mildly T1 hyperintense areas in the peripheral zone of the prostate
bilaterally may relate to prior biopsy.

## 2021-11-08 DIAGNOSIS — Z0000 Encounter for general adult medical examination without abnormal findings: Secondary | ICD-10-CM | POA: Diagnosis not present

## 2021-12-18 DIAGNOSIS — M170 Bilateral primary osteoarthritis of knee: Secondary | ICD-10-CM | POA: Diagnosis not present

## 2021-12-25 DIAGNOSIS — M170 Bilateral primary osteoarthritis of knee: Secondary | ICD-10-CM | POA: Diagnosis not present

## 2022-01-01 DIAGNOSIS — M170 Bilateral primary osteoarthritis of knee: Secondary | ICD-10-CM | POA: Diagnosis not present

## 2022-01-13 DIAGNOSIS — H353132 Nonexudative age-related macular degeneration, bilateral, intermediate dry stage: Secondary | ICD-10-CM | POA: Diagnosis not present

## 2022-01-15 DIAGNOSIS — K219 Gastro-esophageal reflux disease without esophagitis: Secondary | ICD-10-CM | POA: Diagnosis not present

## 2022-01-15 DIAGNOSIS — R69 Illness, unspecified: Secondary | ICD-10-CM | POA: Diagnosis not present

## 2022-01-15 DIAGNOSIS — M1990 Unspecified osteoarthritis, unspecified site: Secondary | ICD-10-CM | POA: Diagnosis not present

## 2022-01-15 DIAGNOSIS — I10 Essential (primary) hypertension: Secondary | ICD-10-CM | POA: Diagnosis not present

## 2022-01-15 DIAGNOSIS — Z8546 Personal history of malignant neoplasm of prostate: Secondary | ICD-10-CM | POA: Diagnosis not present

## 2022-01-15 DIAGNOSIS — Z791 Long term (current) use of non-steroidal anti-inflammatories (NSAID): Secondary | ICD-10-CM | POA: Diagnosis not present

## 2022-01-15 DIAGNOSIS — E785 Hyperlipidemia, unspecified: Secondary | ICD-10-CM | POA: Diagnosis not present

## 2022-01-15 DIAGNOSIS — Z8042 Family history of malignant neoplasm of prostate: Secondary | ICD-10-CM | POA: Diagnosis not present

## 2022-01-15 DIAGNOSIS — R32 Unspecified urinary incontinence: Secondary | ICD-10-CM | POA: Diagnosis not present

## 2022-02-12 DIAGNOSIS — M170 Bilateral primary osteoarthritis of knee: Secondary | ICD-10-CM | POA: Diagnosis not present

## 2022-03-08 DIAGNOSIS — Z23 Encounter for immunization: Secondary | ICD-10-CM | POA: Diagnosis not present

## 2022-04-10 DIAGNOSIS — C61 Malignant neoplasm of prostate: Secondary | ICD-10-CM | POA: Diagnosis not present

## 2022-04-17 DIAGNOSIS — R3915 Urgency of urination: Secondary | ICD-10-CM | POA: Diagnosis not present

## 2022-04-17 DIAGNOSIS — N401 Enlarged prostate with lower urinary tract symptoms: Secondary | ICD-10-CM | POA: Diagnosis not present

## 2022-04-17 DIAGNOSIS — C61 Malignant neoplasm of prostate: Secondary | ICD-10-CM | POA: Diagnosis not present

## 2022-05-16 DIAGNOSIS — D485 Neoplasm of uncertain behavior of skin: Secondary | ICD-10-CM | POA: Diagnosis not present

## 2022-05-16 DIAGNOSIS — D225 Melanocytic nevi of trunk: Secondary | ICD-10-CM | POA: Diagnosis not present

## 2022-05-16 DIAGNOSIS — L821 Other seborrheic keratosis: Secondary | ICD-10-CM | POA: Diagnosis not present

## 2022-05-16 DIAGNOSIS — L905 Scar conditions and fibrosis of skin: Secondary | ICD-10-CM | POA: Diagnosis not present

## 2022-07-17 DIAGNOSIS — H35343 Macular cyst, hole, or pseudohole, bilateral: Secondary | ICD-10-CM | POA: Diagnosis not present

## 2022-07-17 DIAGNOSIS — H353132 Nonexudative age-related macular degeneration, bilateral, intermediate dry stage: Secondary | ICD-10-CM | POA: Diagnosis not present

## 2022-08-15 DIAGNOSIS — M170 Bilateral primary osteoarthritis of knee: Secondary | ICD-10-CM | POA: Diagnosis not present

## 2022-08-21 DIAGNOSIS — M170 Bilateral primary osteoarthritis of knee: Secondary | ICD-10-CM | POA: Diagnosis not present

## 2022-08-28 DIAGNOSIS — M170 Bilateral primary osteoarthritis of knee: Secondary | ICD-10-CM | POA: Diagnosis not present

## 2022-10-16 DIAGNOSIS — N401 Enlarged prostate with lower urinary tract symptoms: Secondary | ICD-10-CM | POA: Diagnosis not present

## 2022-10-24 DIAGNOSIS — R350 Frequency of micturition: Secondary | ICD-10-CM | POA: Diagnosis not present

## 2022-10-24 DIAGNOSIS — C61 Malignant neoplasm of prostate: Secondary | ICD-10-CM | POA: Diagnosis not present

## 2022-10-24 DIAGNOSIS — N401 Enlarged prostate with lower urinary tract symptoms: Secondary | ICD-10-CM | POA: Diagnosis not present

## 2022-11-04 DIAGNOSIS — K219 Gastro-esophageal reflux disease without esophagitis: Secondary | ICD-10-CM | POA: Diagnosis not present

## 2022-11-04 DIAGNOSIS — R7303 Prediabetes: Secondary | ICD-10-CM | POA: Diagnosis not present

## 2022-11-04 DIAGNOSIS — Z125 Encounter for screening for malignant neoplasm of prostate: Secondary | ICD-10-CM | POA: Diagnosis not present

## 2022-11-04 DIAGNOSIS — I10 Essential (primary) hypertension: Secondary | ICD-10-CM | POA: Diagnosis not present

## 2022-11-04 DIAGNOSIS — E785 Hyperlipidemia, unspecified: Secondary | ICD-10-CM | POA: Diagnosis not present

## 2022-11-11 DIAGNOSIS — Z1339 Encounter for screening examination for other mental health and behavioral disorders: Secondary | ICD-10-CM | POA: Diagnosis not present

## 2022-11-11 DIAGNOSIS — Z1212 Encounter for screening for malignant neoplasm of rectum: Secondary | ICD-10-CM | POA: Diagnosis not present

## 2022-11-11 DIAGNOSIS — E1165 Type 2 diabetes mellitus with hyperglycemia: Secondary | ICD-10-CM | POA: Diagnosis not present

## 2022-11-11 DIAGNOSIS — N1831 Chronic kidney disease, stage 3a: Secondary | ICD-10-CM | POA: Diagnosis not present

## 2022-11-11 DIAGNOSIS — E785 Hyperlipidemia, unspecified: Secondary | ICD-10-CM | POA: Diagnosis not present

## 2022-11-11 DIAGNOSIS — N401 Enlarged prostate with lower urinary tract symptoms: Secondary | ICD-10-CM | POA: Diagnosis not present

## 2022-11-11 DIAGNOSIS — E1169 Type 2 diabetes mellitus with other specified complication: Secondary | ICD-10-CM | POA: Diagnosis not present

## 2022-11-11 DIAGNOSIS — I129 Hypertensive chronic kidney disease with stage 1 through stage 4 chronic kidney disease, or unspecified chronic kidney disease: Secondary | ICD-10-CM | POA: Diagnosis not present

## 2022-11-11 DIAGNOSIS — Z1331 Encounter for screening for depression: Secondary | ICD-10-CM | POA: Diagnosis not present

## 2022-11-11 DIAGNOSIS — R82998 Other abnormal findings in urine: Secondary | ICD-10-CM | POA: Diagnosis not present

## 2022-11-11 DIAGNOSIS — K219 Gastro-esophageal reflux disease without esophagitis: Secondary | ICD-10-CM | POA: Diagnosis not present

## 2022-11-11 DIAGNOSIS — Z0000 Encounter for general adult medical examination without abnormal findings: Secondary | ICD-10-CM | POA: Diagnosis not present

## 2022-11-11 DIAGNOSIS — C61 Malignant neoplasm of prostate: Secondary | ICD-10-CM | POA: Diagnosis not present

## 2022-12-10 DIAGNOSIS — M170 Bilateral primary osteoarthritis of knee: Secondary | ICD-10-CM | POA: Diagnosis not present

## 2023-01-15 DIAGNOSIS — H353132 Nonexudative age-related macular degeneration, bilateral, intermediate dry stage: Secondary | ICD-10-CM | POA: Diagnosis not present

## 2023-03-07 ENCOUNTER — Ambulatory Visit
Admission: EM | Admit: 2023-03-07 | Discharge: 2023-03-07 | Disposition: A | Discharge status: Home / Self Care | Payer: Medicare HMO | Attending: Internal Medicine | Admitting: Internal Medicine

## 2023-03-07 DIAGNOSIS — S1096XA Insect bite of unspecified part of neck, initial encounter: Secondary | ICD-10-CM

## 2023-03-07 DIAGNOSIS — R21 Rash and other nonspecific skin eruption: Secondary | ICD-10-CM

## 2023-03-07 DIAGNOSIS — W57XXXA Bitten or stung by nonvenomous insect and other nonvenomous arthropods, initial encounter: Secondary | ICD-10-CM | POA: Diagnosis not present

## 2023-03-07 MED ORDER — DOXYCYCLINE HYCLATE 100 MG PO CAPS: 100.0000 mg | ORAL_CAPSULE | Freq: Two times a day (BID) | ORAL | 0 refills | Status: DC

## 2023-03-07 NOTE — ED Triage Notes (Signed)
Pt presents to UC w/ c/o possible tick bite noticed yesterday to the left side of the neck.

## 2023-03-07 NOTE — ED Provider Notes (Signed)
[facility]    CSN: [id number] Arrival date & time: 03/07/23  6578      History   Chief Complaint No chief complaint on file.   HPI Jonathan Jimenez is a 77 y.o. male presents for a tick bite.  Patient reports yesterday he noticed a scabbed area on the left side of his neck that he removed with a tissue. States it was a tick that was engorged.  He thinks it was a deer tick.  He thinks it was attached for at least 24 hours if not longer.  Today he noticed a rash around the area prompting him to come in for evaluation.  No fevers, chills, myalgias.  Denies history of Lyme disease.  No other concerns at this time.  HPI  Past Medical History:  Diagnosis Date   Hypertension     There are no problems to display for this patient.   Past Surgical History:  Procedure Laterality Date   APPENDECTOMY         Home Medications    Prior to Admission medications   Medication Sig Start Date End Date Taking? Authorizing Provider  doxycycline (VIBRAMYCIN) 100 MG capsule Take 1 capsule (100 mg total) by mouth 2 (two) times daily. 03/07/23  Yes Radford Pax, NP  amLODipine (NORVASC) 10 MG tablet  09/13/14   [provider]  carvedilol (COREG) 25 MG tablet TAKE 1 TABLET BY MOUTH TWICE A DAY FOR HYPERTENSION    [provider]  esomeprazole (NEXIUM) 20 MG capsule 1 capsule Orally Once a day    [provider]  simvastatin (ZOCOR) 20 MG tablet  09/13/14   [provider]  tamsulosin (FLOMAX) 0.4 MG CAPS capsule TAKE 1 CAPSULE BY MOUTH EVERYDAY AT BEDTIME    [provider]  triamterene-hydrochlorothiazide (MAXZIDE) 75-50 MG per tablet  09/13/14   [provider]  valsartan (DIOVAN) 320 MG tablet  09/13/14   [provider]    Family History Family History  Problem Relation Age of Onset   Colon cancer Neg Hx    Diabetes Mother    Prostate cancer Father     Social History Social History   Tobacco Use   Smoking status:  Never   Smokeless tobacco: Never  Substance Use Topics   Alcohol use: No    Alcohol/week: 0.0 standard drinks of alcohol   Drug use: No     Allergies   Patient has no known allergies.   Review of Systems Review of Systems  Skin:        Tick bite     Physical Exam Triage Vital Signs ED Triage Vitals  Encounter Vitals Group     BP 03/07/23 0910 (!) 163/80     Systolic BP Percentile --      Diastolic BP Percentile --      Pulse Rate 03/07/23 0910 82     Resp 03/07/23 0910 16     Temp 03/07/23 0910 97.9 F (36.6 C)     Temp Source 03/07/23 0910 Oral     SpO2 03/07/23 0910 97 %     Weight --      Height --      Head Circumference --      Peak Flow --      Pain Score 03/07/23 0908 0     Pain Loc --      Pain Education --      Exclude from Growth Chart --    No data  found.  Updated Vital Signs BP (!) 163/80 (BP Location: Left Arm)   Pulse 82   Temp 97.9 F (36.6 C) (Oral)   Resp 16   SpO2 97%   Visual Acuity Right Eye Distance:   Left Eye Distance:   Bilateral Distance:    Right Eye Near:   Left Eye Near:    Bilateral Near:     Physical Exam Vitals and nursing note reviewed.  Constitutional:      General: He is not in acute distress.    Appearance: Normal appearance. He is not ill-appearing.  HENT:     Head: Normocephalic and atraumatic.  Eyes:     Pupils: Pupils are equal, round, and reactive to light.  Neck:      Comments: There is a small scabbed lesion with a mildly erythematous bull's-eye type rash extending from this.  No swelling, drainage, induration, fluctuance, warmth. Cardiovascular:     Rate and Rhythm: Normal rate.  Pulmonary:     Effort: Pulmonary effort is normal.  Skin:    General: Skin is warm and dry.  Neurological:     General: No focal deficit present.     Mental Status: He is alert and oriented to person, place, and time.  Psychiatric:        Mood and Affect: Mood normal.        Behavior: Behavior normal.      UC  Treatments / Results  Labs (all labs ordered are listed, but only abnormal results are displayed) Labs Reviewed - No data to display  EKG   Radiology No results found.  Procedures Procedures (including critical care time)  Medications Ordered in UC Medications - No data to display  Initial Impression / Assessment and Plan / UC Course  I have reviewed the triage vital signs and the nursing notes.  Pertinent labs & imaging results that were available during my care of the patient were reviewed by me and considered in my medical decision making (see chart for details).     Reviewed exam and symptoms with patient.  Known tick bite with bull's-eye type rash.  Will start doxycycline for potential Lyme exposure.  Side effect profile reviewed.  Advised follow-up with PCP 1 to 2 weeks for follow-up/recheck.  ER precautions reviewed and patient verbalized understanding. Final Clinical Impressions(s) / UC Diagnoses   Final diagnoses:  Tick bite of neck, initial encounter  Rash of neck     Discharge Instructions      Start doxycycline twice daily for 10 days.  Follow-up with your PCP in 1 to 2 weeks for recheck/follow-up.  Please go to emergency room if you develop any worsening symptoms.  I hope you feel better soon!    ED Prescriptions     Medication Sig Dispense Auth. Provider   doxycycline (VIBRAMYCIN) 100 MG capsule Take 1 capsule (100 mg total) by mouth 2 (two) times daily. 20 capsule Radford Pax, NP      PDMP not reviewed this encounter.   Radford Pax, NP 03/07/23 [phone]

## 2023-03-07 NOTE — Discharge Instructions (Signed)
Start doxycycline twice daily for 10 days.  Follow-up with your PCP in 1 to 2 weeks for recheck/follow-up.  Please go to emergency room if you develop any worsening symptoms.  I hope you feel better soon!

## 2023-03-13 DIAGNOSIS — I129 Hypertensive chronic kidney disease with stage 1 through stage 4 chronic kidney disease, or unspecified chronic kidney disease: Secondary | ICD-10-CM | POA: Diagnosis not present

## 2023-03-13 DIAGNOSIS — Z23 Encounter for immunization: Secondary | ICD-10-CM | POA: Diagnosis not present

## 2023-03-13 DIAGNOSIS — N1831 Chronic kidney disease, stage 3a: Secondary | ICD-10-CM | POA: Diagnosis not present

## 2023-03-13 DIAGNOSIS — K219 Gastro-esophageal reflux disease without esophagitis: Secondary | ICD-10-CM | POA: Diagnosis not present

## 2023-03-13 DIAGNOSIS — E785 Hyperlipidemia, unspecified: Secondary | ICD-10-CM | POA: Diagnosis not present

## 2023-03-13 DIAGNOSIS — E1169 Type 2 diabetes mellitus with other specified complication: Secondary | ICD-10-CM | POA: Diagnosis not present

## 2023-03-13 DIAGNOSIS — C61 Malignant neoplasm of prostate: Secondary | ICD-10-CM | POA: Diagnosis not present

## 2023-03-27 DIAGNOSIS — M170 Bilateral primary osteoarthritis of knee: Secondary | ICD-10-CM | POA: Diagnosis not present

## 2023-04-03 DIAGNOSIS — M170 Bilateral primary osteoarthritis of knee: Secondary | ICD-10-CM | POA: Diagnosis not present

## 2023-04-08 DIAGNOSIS — M170 Bilateral primary osteoarthritis of knee: Secondary | ICD-10-CM | POA: Diagnosis not present

## 2023-04-16 DIAGNOSIS — Z0100 Encounter for examination of eyes and vision without abnormal findings: Secondary | ICD-10-CM | POA: Diagnosis not present

## 2023-07-17 DIAGNOSIS — H353132 Nonexudative age-related macular degeneration, bilateral, intermediate dry stage: Secondary | ICD-10-CM | POA: Diagnosis not present

## 2023-07-20 DIAGNOSIS — I129 Hypertensive chronic kidney disease with stage 1 through stage 4 chronic kidney disease, or unspecified chronic kidney disease: Secondary | ICD-10-CM | POA: Diagnosis not present

## 2023-07-20 DIAGNOSIS — Z23 Encounter for immunization: Secondary | ICD-10-CM | POA: Diagnosis not present

## 2023-07-20 DIAGNOSIS — E1169 Type 2 diabetes mellitus with other specified complication: Secondary | ICD-10-CM | POA: Diagnosis not present

## 2023-08-28 DIAGNOSIS — D225 Melanocytic nevi of trunk: Secondary | ICD-10-CM | POA: Diagnosis not present

## 2023-08-28 DIAGNOSIS — L570 Actinic keratosis: Secondary | ICD-10-CM | POA: Diagnosis not present

## 2023-08-28 DIAGNOSIS — L821 Other seborrheic keratosis: Secondary | ICD-10-CM | POA: Diagnosis not present

## 2023-08-28 DIAGNOSIS — X32XXXD Exposure to sunlight, subsequent encounter: Secondary | ICD-10-CM | POA: Diagnosis not present

## 2023-11-16 DIAGNOSIS — E785 Hyperlipidemia, unspecified: Secondary | ICD-10-CM | POA: Diagnosis not present

## 2023-11-16 DIAGNOSIS — C61 Malignant neoplasm of prostate: Secondary | ICD-10-CM | POA: Diagnosis not present

## 2023-11-16 DIAGNOSIS — E1169 Type 2 diabetes mellitus with other specified complication: Secondary | ICD-10-CM | POA: Diagnosis not present

## 2023-11-18 ENCOUNTER — Emergency Department (HOSPITAL_BASED_OUTPATIENT_CLINIC_OR_DEPARTMENT_OTHER)

## 2023-11-18 ENCOUNTER — Emergency Department (HOSPITAL_BASED_OUTPATIENT_CLINIC_OR_DEPARTMENT_OTHER): Admitting: Radiology

## 2023-11-18 ENCOUNTER — Encounter (HOSPITAL_BASED_OUTPATIENT_CLINIC_OR_DEPARTMENT_OTHER): Payer: Self-pay | Admitting: Emergency Medicine

## 2023-11-18 ENCOUNTER — Inpatient Hospital Stay (HOSPITAL_BASED_OUTPATIENT_CLINIC_OR_DEPARTMENT_OTHER)
Admission: EM | Admit: 2023-11-18 | Discharge: 2023-11-26 | DRG: 871 | Disposition: A | Discharge status: Home / Self Care | Attending: Internal Medicine | Admitting: Internal Medicine

## 2023-11-18 ENCOUNTER — Other Ambulatory Visit: Payer: Self-pay

## 2023-11-18 DIAGNOSIS — I130 Hypertensive heart and chronic kidney disease with heart failure and stage 1 through stage 4 chronic kidney disease, or unspecified chronic kidney disease: Secondary | ICD-10-CM | POA: Diagnosis not present

## 2023-11-18 DIAGNOSIS — E876 Hypokalemia: Secondary | ICD-10-CM | POA: Diagnosis not present

## 2023-11-18 DIAGNOSIS — J9601 Acute respiratory failure with hypoxia: Principal | ICD-10-CM | POA: Diagnosis present

## 2023-11-18 DIAGNOSIS — R0609 Other forms of dyspnea: Secondary | ICD-10-CM | POA: Diagnosis not present

## 2023-11-18 DIAGNOSIS — A419 Sepsis, unspecified organism: Secondary | ICD-10-CM | POA: Diagnosis not present

## 2023-11-18 DIAGNOSIS — Z4682 Encounter for fitting and adjustment of non-vascular catheter: Secondary | ICD-10-CM | POA: Diagnosis not present

## 2023-11-18 DIAGNOSIS — J90 Pleural effusion, not elsewhere classified: Secondary | ICD-10-CM | POA: Diagnosis not present

## 2023-11-18 DIAGNOSIS — R6520 Severe sepsis without septic shock: Secondary | ICD-10-CM | POA: Diagnosis not present

## 2023-11-18 DIAGNOSIS — J9811 Atelectasis: Secondary | ICD-10-CM | POA: Diagnosis not present

## 2023-11-18 DIAGNOSIS — J869 Pyothorax without fistula: Secondary | ICD-10-CM | POA: Diagnosis not present

## 2023-11-18 DIAGNOSIS — N179 Acute kidney failure, unspecified: Secondary | ICD-10-CM | POA: Diagnosis present

## 2023-11-18 DIAGNOSIS — I10 Essential (primary) hypertension: Secondary | ICD-10-CM | POA: Diagnosis not present

## 2023-11-18 DIAGNOSIS — R9389 Abnormal findings on diagnostic imaging of other specified body structures: Secondary | ICD-10-CM | POA: Diagnosis not present

## 2023-11-18 DIAGNOSIS — R0602 Shortness of breath: Secondary | ICD-10-CM | POA: Diagnosis not present

## 2023-11-18 DIAGNOSIS — I129 Hypertensive chronic kidney disease with stage 1 through stage 4 chronic kidney disease, or unspecified chronic kidney disease: Secondary | ICD-10-CM | POA: Diagnosis not present

## 2023-11-18 DIAGNOSIS — Z79899 Other long term (current) drug therapy: Secondary | ICD-10-CM | POA: Diagnosis not present

## 2023-11-18 DIAGNOSIS — E1122 Type 2 diabetes mellitus with diabetic chronic kidney disease: Secondary | ICD-10-CM | POA: Diagnosis present

## 2023-11-18 DIAGNOSIS — Z833 Family history of diabetes mellitus: Secondary | ICD-10-CM | POA: Diagnosis not present

## 2023-11-18 DIAGNOSIS — I959 Hypotension, unspecified: Secondary | ICD-10-CM | POA: Diagnosis present

## 2023-11-18 DIAGNOSIS — N1831 Chronic kidney disease, stage 3a: Secondary | ICD-10-CM | POA: Diagnosis not present

## 2023-11-18 DIAGNOSIS — J811 Chronic pulmonary edema: Secondary | ICD-10-CM | POA: Diagnosis not present

## 2023-11-18 DIAGNOSIS — Z7984 Long term (current) use of oral hypoglycemic drugs: Secondary | ICD-10-CM | POA: Diagnosis not present

## 2023-11-18 DIAGNOSIS — J189 Pneumonia, unspecified organism: Secondary | ICD-10-CM | POA: Diagnosis present

## 2023-11-18 DIAGNOSIS — I5033 Acute on chronic diastolic (congestive) heart failure: Secondary | ICD-10-CM | POA: Diagnosis present

## 2023-11-18 DIAGNOSIS — E785 Hyperlipidemia, unspecified: Secondary | ICD-10-CM | POA: Diagnosis not present

## 2023-11-18 DIAGNOSIS — Z8042 Family history of malignant neoplasm of prostate: Secondary | ICD-10-CM

## 2023-11-18 DIAGNOSIS — R918 Other nonspecific abnormal finding of lung field: Secondary | ICD-10-CM | POA: Diagnosis not present

## 2023-11-18 DIAGNOSIS — E119 Type 2 diabetes mellitus without complications: Secondary | ICD-10-CM | POA: Diagnosis not present

## 2023-11-18 DIAGNOSIS — R0989 Other specified symptoms and signs involving the circulatory and respiratory systems: Secondary | ICD-10-CM | POA: Diagnosis not present

## 2023-11-18 DIAGNOSIS — R590 Localized enlarged lymph nodes: Secondary | ICD-10-CM | POA: Diagnosis not present

## 2023-11-18 DIAGNOSIS — Z48813 Encounter for surgical aftercare following surgery on the respiratory system: Secondary | ICD-10-CM | POA: Diagnosis not present

## 2023-11-18 DIAGNOSIS — I517 Cardiomegaly: Secondary | ICD-10-CM | POA: Diagnosis not present

## 2023-11-18 DIAGNOSIS — J939 Pneumothorax, unspecified: Secondary | ICD-10-CM | POA: Diagnosis not present

## 2023-11-18 DIAGNOSIS — M549 Dorsalgia, unspecified: Secondary | ICD-10-CM | POA: Diagnosis not present

## 2023-11-18 LAB — CBC
HCT: 38.2 % — ABNORMAL LOW (ref 39.0–52.0)
Hemoglobin: 13.2 g/dL (ref 13.0–17.0)
MCH: 30.9 pg (ref 26.0–34.0)
MCHC: 34.6 g/dL (ref 30.0–36.0)
MCV: 89.5 fL (ref 80.0–100.0)
Platelets: 286 10*3/uL (ref 150–400)
RBC: 4.27 MIL/uL (ref 4.22–5.81)
RDW: 13.2 % (ref 11.5–15.5)
WBC: 26.8 10*3/uL — ABNORMAL HIGH (ref 4.0–10.5)
nRBC: 0 % (ref 0.0–0.2)

## 2023-11-18 LAB — TROPONIN T, HIGH SENSITIVITY
Troponin T High Sensitivity: 35 ng/L — ABNORMAL HIGH
Troponin T High Sensitivity: 32 ng/L — ABNORMAL HIGH (ref <19)

## 2023-11-18 LAB — BASIC METABOLIC PANEL WITH GFR
Anion gap: 18 — ABNORMAL HIGH (ref 5–15)
BUN: 35 mg/dL — ABNORMAL HIGH (ref 8–23)
CO2: 22 mmol/L (ref 22–32)
Calcium: 9.7 mg/dL (ref 8.9–10.3)
Chloride: 95 mmol/L — ABNORMAL LOW (ref 98–111)
Creatinine, Ser: 1.91 mg/dL — ABNORMAL HIGH (ref 0.61–1.24)
GFR, Estimated: 36 mL/min — ABNORMAL LOW (ref >60)
Glucose, Bld: 205 mg/dL — ABNORMAL HIGH (ref 70–99)
Potassium: 3.7 mmol/L (ref 3.5–5.1)
Sodium: 135 mmol/L (ref 135–145)

## 2023-11-18 LAB — LACTIC ACID, PLASMA
Lactic Acid, Venous: 2.2 mmol/L (ref 0.5–1.9)
Lactic Acid, Venous: 1.6 mmol/L (ref 0.5–1.9)

## 2023-11-18 LAB — PRO BRAIN NATRIURETIC PEPTIDE: Pro Brain Natriuretic Peptide: 399 pg/mL — ABNORMAL HIGH (ref <300.0)

## 2023-11-18 MED ORDER — ACETAMINOPHEN 325 MG PO TABS
650.0000 mg | ORAL_TABLET | Freq: Four times a day (QID) | ORAL | Status: DC | PRN
  Administered 2023-11-18 – 2023-11-25 (×10): 650 mg via ORAL
  Filled 2023-11-18 (×10): qty 2

## 2023-11-18 MED ORDER — GUAIFENESIN ER 600 MG PO TB12
600.0000 mg | ORAL_TABLET | Freq: Two times a day (BID) | ORAL | Status: DC
  Administered 2023-11-18 – 2023-11-26 (×14): 600 mg via ORAL
  Filled 2023-11-18 (×16): qty 1

## 2023-11-18 MED ORDER — ADULT MULTIVITAMIN W/MINERALS CH
1.0000 | ORAL_TABLET | Freq: Every day | ORAL | Status: DC
  Administered 2023-11-20 – 2023-11-26 (×7): 1 via ORAL
  Filled 2023-11-18 (×8): qty 1

## 2023-11-18 MED ORDER — SODIUM CHLORIDE 0.9 % IV BOLUS
1000.0000 mL | Freq: Once | INTRAVENOUS | Status: AC
  Administered 2023-11-18: 1000 mL via INTRAVENOUS

## 2023-11-18 MED ORDER — INSULIN ASPART 100 UNIT/ML IJ SOLN
0.0000 [IU] | Freq: Three times a day (TID) | INTRAMUSCULAR | Status: DC
  Administered 2023-11-19 – 2023-11-22 (×10): 2 [IU] via SUBCUTANEOUS
  Administered 2023-11-23: 1 [IU] via SUBCUTANEOUS
  Administered 2023-11-23 (×2): 2 [IU] via SUBCUTANEOUS
  Administered 2023-11-24: 3 [IU] via SUBCUTANEOUS
  Administered 2023-11-24: 2 [IU] via SUBCUTANEOUS
  Administered 2023-11-24 – 2023-11-25 (×2): 1 [IU] via SUBCUTANEOUS
  Administered 2023-11-25: 2 [IU] via SUBCUTANEOUS
  Administered 2023-11-25 – 2023-11-26 (×2): 1 [IU] via SUBCUTANEOUS
  Administered 2023-11-26: 2 [IU] via SUBCUTANEOUS

## 2023-11-18 MED ORDER — PANTOPRAZOLE SODIUM 40 MG PO TBEC
40.0000 mg | DELAYED_RELEASE_TABLET | Freq: Every day | ORAL | Status: DC
  Administered 2023-11-20 – 2023-11-26 (×7): 40 mg via ORAL
  Filled 2023-11-18 (×8): qty 1

## 2023-11-18 MED ORDER — SODIUM CHLORIDE 0.9 % IV SOLN
500.0000 mg | Freq: Once | INTRAVENOUS | Status: AC
  Administered 2023-11-18: 500 mg via INTRAVENOUS
  Filled 2023-11-18: qty 5

## 2023-11-18 MED ORDER — SODIUM CHLORIDE 0.9 % IV SOLN
2.0000 g | INTRAVENOUS | Status: AC
  Administered 2023-11-19 – 2023-11-23 (×5): 2 g via INTRAVENOUS
  Filled 2023-11-18 (×5): qty 20

## 2023-11-18 MED ORDER — SODIUM CHLORIDE 0.9 % IV SOLN
500.0000 mg | INTRAVENOUS | Status: DC
  Administered 2023-11-19 – 2023-11-22 (×4): 500 mg via INTRAVENOUS
  Filled 2023-11-18 (×5): qty 5

## 2023-11-18 MED ORDER — SIMVASTATIN 20 MG PO TABS
20.0000 mg | ORAL_TABLET | Freq: Every evening | ORAL | Status: DC
  Administered 2023-11-19 – 2023-11-26 (×8): 20 mg via ORAL
  Filled 2023-11-18 (×8): qty 1

## 2023-11-18 MED ORDER — ONDANSETRON HCL 4 MG/2ML IJ SOLN: 4.0000 mg | Freq: Four times a day (QID) | INTRAMUSCULAR | Status: DC | PRN

## 2023-11-18 MED ORDER — HEPARIN SODIUM (PORCINE) 5000 UNIT/ML IJ SOLN
5000.0000 [IU] | Freq: Three times a day (TID) | INTRAMUSCULAR | Status: DC
  Administered 2023-11-20 – 2023-11-26 (×20): 5000 [IU] via SUBCUTANEOUS
  Filled 2023-11-18 (×22): qty 1

## 2023-11-18 MED ORDER — ONDANSETRON HCL 4 MG PO TABS: 4.0000 mg | ORAL_TABLET | Freq: Four times a day (QID) | ORAL | Status: DC | PRN

## 2023-11-18 MED ORDER — ACETAMINOPHEN 650 MG RE SUPP: 650.0000 mg | Freq: Four times a day (QID) | RECTAL | Status: DC | PRN

## 2023-11-18 MED ORDER — OXYCODONE HCL 5 MG PO TABS: 5.0000 mg | ORAL_TABLET | ORAL | Status: DC | PRN

## 2023-11-18 MED ORDER — SODIUM CHLORIDE 0.9 % IV SOLN: INTRAVENOUS | Status: AC

## 2023-11-18 MED ORDER — SODIUM CHLORIDE 0.9 % IV SOLN
1.0000 g | Freq: Once | INTRAVENOUS | Status: AC
  Administered 2023-11-18: 1 g via INTRAVENOUS
  Filled 2023-11-18: qty 10

## 2023-11-18 MED ORDER — IPRATROPIUM-ALBUTEROL 0.5-2.5 (3) MG/3ML IN SOLN
3.0000 mL | RESPIRATORY_TRACT | Status: DC | PRN
  Administered 2023-11-20 (×2): 3 mL via RESPIRATORY_TRACT
  Filled 2023-11-18 (×3): qty 3

## 2023-11-18 NOTE — ED Notes (Signed)
 RT in to assess patient. Patient lying in bed on 4LNC, SpO2 93-95%. RR mid 20's to 30. Patient states his breathing feels comfortable at this time. Call bell within reach.

## 2023-11-18 NOTE — ED Notes (Signed)
 [facility] advised they are otw to take patient to [facility] rm#[id number]

## 2023-11-18 NOTE — ED Triage Notes (Signed)
 Pt via pov from home with back pain that gives me shortness of breath. Pt states it began two nights ago. Pain started in upper back; migrated farther down the back. Settled just below ribs. Denies any other symptoms, including urinary. Pt alert & oriented, nad noted.

## 2023-11-18 NOTE — ED Provider Notes (Signed)
 [place] EMERGENCY DEPARTMENT AT [facility] Provider Note   CSN: [id number] Arrival date & time: 11/18/23  1426     Patient presents with: Back Pain   Jonathan Jimenez is a 78 y.o. male.   Patient with history of diabetes, hypertension, chronic kidney disease (3a per chart, 11/2022 crt 1.3, GERD --presents to the emergency department for evaluation of right-sided back pain.  Patient states that he developed the pain while at rest about 2 days ago.  It was in the right shoulder blade area.  The pain is intermittent, goes away at times but then returns.  It is brought on by certain positions and deep breathing.  He states that the pain has come down in the right back to below his shoulder blade.  No anterior chest pain or abdominal pain.  He does feel shortness of breath with the pain.  Does not really endorse shortness of breath with exertion.  No lower extremity swelling.  No weakness, numbness, or tingling in the arms of the legs.  No strokelike symptoms, vision loss.       Prior to Admission medications   Medication Sig Start Date End Date Taking? Authorizing Provider  amLODipine (NORVASC) 10 MG tablet  09/13/14   [provider]  carvedilol (COREG) 25 MG tablet TAKE 1 TABLET BY MOUTH TWICE A DAY FOR HYPERTENSION    [provider]  doxycycline  (VIBRAMYCIN ) 100 MG capsule Take 1 capsule (100 mg total) by mouth 2 (two) times daily. 03/07/23   Mayer, Jodi R, NP  esomeprazole (NEXIUM) 20 MG capsule 1 capsule Orally Once a day    [provider]  simvastatin (ZOCOR) 20 MG tablet  09/13/14   [provider]  tamsulosin (FLOMAX) 0.4 MG CAPS capsule TAKE 1 CAPSULE BY MOUTH EVERYDAY AT BEDTIME    [provider]  triamterene-hydrochlorothiazide (MAXZIDE) 75-50 MG per tablet  09/13/14   [provider]  valsartan (DIOVAN) 320 MG tablet  09/13/14   [provider]    Allergies: Patient has no known allergies.    Review of  Systems  Updated Vital Signs BP 112/64   Pulse 91   Temp 97.8 F (36.6 C)   Resp (!) 23   Ht 5' 9 (1.753 m)   Wt 102.1 kg   SpO2 94%   BMI 33.23 kg/m   Physical Exam Vitals and nursing note reviewed.  Constitutional:      Appearance: He is well-developed. He is not diaphoretic.  HENT:     Head: Normocephalic and atraumatic.     Mouth/Throat:     Mouth: Mucous membranes are not dry.   Eyes:     Conjunctiva/sclera: Conjunctivae normal.   Neck:     Vascular: Normal carotid pulses. No carotid bruit or JVD.     Trachea: Trachea normal. No tracheal deviation.   Cardiovascular:     Rate and Rhythm: Normal rate and regular rhythm.     Pulses: No decreased pulses.          Radial pulses are 2+ on the right side and 2+ on the left side.       Dorsalis pedis pulses are 2+ on the right side and 2+ on the left side.     Heart sounds: Normal heart sounds, S1 normal and S2 normal. Heart sounds not distant. No murmur heard. Pulmonary:     Effort: Pulmonary effort is normal. No respiratory distress.     Breath sounds: Normal breath sounds. No wheezing.  Chest:     Chest wall: No tenderness.  Abdominal:     General: Bowel sounds are normal.     Palpations: Abdomen is soft.     Tenderness: There is no abdominal tenderness. There is no guarding or rebound.   Musculoskeletal:     Cervical back: Normal range of motion and neck supple. No muscular tenderness.     Right lower leg: No edema.     Left lower leg: No edema.     Comments: Patient has a lot of discomfort trying to sit forward in bed.  With palpation over the scapula and right thoracic back area, pain is not really reproduced with palpation.   Skin:    General: Skin is warm and dry.     Coloration: Skin is not pale.   Neurological:     Mental Status: He is alert. Mental status is at baseline.   Psychiatric:        Mood and Affect: Mood normal.    ED Course  Patient seen and examined. History obtained directly from  patient.  Reviewed outpatient PCP note.  Labs/EKG: Ordered CBC, BMP, troponin.  EKG personally reviewed and interpreted as below.  Imaging: Ordered chest x-ray.  Medications/Fluids: None ordered  Most recent vital signs reviewed and are as follows: BP 112/64   Pulse 91   Temp 97.8 F (36.6 C)   Resp (!) 23   Ht 5' 9 (1.753 m)   Wt 102.1 kg   SpO2 94%   BMI 33.23 kg/m   Initial impression: Unclear etiology.  Patient is on 2 L nasal cannula oxygen at 94%.  He did have desaturation to 97% at rest.  Will likely need additional imaging labs or chest x-ray.  4:06 PM Reassessment performed. Patient appears stable.  Labs personally reviewed and interpreted including: CBC with white blood cell count elevated to 26.8, hemoglobin normal; BMP with creatinine 1.91 with a BUN of 35 which appears to be elevated from baseline, glucose 205 with anion gap of 18, normal bicarbonate, low clinical concern for DKA; troponin elevated at 35.  Imaging personally visualized and interpreted including: Chest x-ray with infiltrate right base.   Reviewed pertinent lab work and imaging with patient, wife, friends at bedside. Questions answered. Discussed likely need for admission.   Most current vital signs reviewed and are as follows: BP 114/61   Pulse 91   Temp 97.8 F (36.6 C)   Resp (!) 29   Ht 5' 9 (1.753 m)   Wt 102.1 kg   SpO2 93%   BMI 33.23 kg/m   Plan: CT chest, added lactic, cultures, BNP. Abx PNA.   Discussed with Dr. Aldean Amass who will see patient.   4:32 PM Lactic 2.2. Fluid bolus ordered.   7:06 PM Reassessment performed. Patient appears stable.  Labs personally reviewed and interpreted including: Lactic 2.2 > 1.6; BNP elevated 399; troponin 35 >> 32.   Imaging personally visualized and interpreted including: CT with moderate loculated right pleural effusion with consolidation consistent with pneumonia.  Reviewed pertinent lab work and imaging with patient at bedside. Questions  answered.   Most current vital signs reviewed and are as follows: BP 128/66   Pulse 91   Temp 97.8 F (36.6 C)   Resp (!) 23   Ht 5' 9 (1.753 m)   Wt 102.1 kg   SpO2 93%   BMI 33.23 kg/m   Plan: Admit to hospital.   7:17 PM Admit orders have been placed by Dr.  Patel.   CRITICAL CARE Performed by: Lyna Sandhoff PA-C Total critical care time: 50 minutes Critical care time was exclusive of separately billable procedures and treating other patients. Critical care was necessary to treat or prevent imminent or life-threatening deterioration. Critical care was time spent personally by me on the following activities: development of treatment plan with patient and/or surrogate as well as nursing, discussions with consultants, evaluation of patient's response to treatment, examination of patient, obtaining history from patient or surrogate, ordering and performing treatments and interventions, ordering and review of laboratory studies, ordering and review of radiographic studies, pulse oximetry and re-evaluation of patient's condition.    (all labs ordered are listed, but only abnormal results are displayed) Labs Reviewed  BASIC METABOLIC PANEL WITH GFR - Abnormal; Notable for the following components:      Result Value   Chloride 95 (*)    Glucose, Bld 205 (*)    BUN 35 (*)    Creatinine, Ser 1.91 (*)    GFR, Estimated 36 (*)    Anion gap 18 (*)    All other components within normal limits  CBC - Abnormal; Notable for the following components:   WBC 26.8 (*)    HCT 38.2 (*)    All other components within normal limits  LACTIC ACID, PLASMA - Abnormal; Notable for the following components:   Lactic Acid, Venous 2.2 (*)    All other components within normal limits  TROPONIN T, HIGH SENSITIVITY - Abnormal; Notable for the following components:   Troponin T High Sensitivity 35 (*)    All other components within normal limits  CULTURE, BLOOD (ROUTINE X 2)  CULTURE, BLOOD (ROUTINE X  2)  LACTIC ACID, PLASMA  PRO BRAIN NATRIURETIC PEPTIDE  TROPONIN T, HIGH SENSITIVITY    EKG: EKG Interpretation Date/Time:  Wednesday November 18 2023 14:40:08 EDT Ventricular Rate:  98 PR Interval:  162 QRS Duration:  92 QT Interval:  350 QTC Calculation: 446 R Axis:   -45  Text Interpretation: Sinus rhythm with occasional Premature ventricular complexes Left anterior fascicular block  diffuse nonspecific T waves      No old tracing to compare  Confirmed by Jerilynn Montenegro [phone]) on 11/18/2023 3:05:39 PM  Radiology: CT Chest Wo Contrast Result Date: 11/18/2023 CLINICAL DATA:  Abnormal chest x-ray with back pain short of breath EXAM: CT CHEST WITHOUT CONTRAST TECHNIQUE: Multidetector CT imaging of the chest was performed following the standard protocol without IV contrast. RADIATION DOSE REDUCTION: This exam was performed according to the departmental dose-optimization program which includes automated exposure control, adjustment of the mA and/or kV according to patient size and/or use of iterative reconstruction technique. COMPARISON:  Chest x-ray 11/18/2023 FINDINGS: Cardiovascular: Limited evaluation without intravenous contrast. Moderate aortic atherosclerosis. Nonaneurysmal aorta. Borderline cardiomegaly. Trace pericardial effusion. Coronary vascular calcifications. Mediastinum/Nodes: Patent trachea. No suspicious thyroid  mass. Calcified mediastinal and hilar nodes consistent with prior granulomatous disease. Mildly enlarged paratracheal nodes measuring up to 14 mm. Enlarged subcarinal nodes measuring up to 18 mm. Esophagus within normal limits. Lungs/Pleura: Moderate loculated right pleural effusion. Consolidation within the right middle and lower lobes with air bronchograms. Punctate calcific densities within the consolidated right lower lobe. Similar punctate calcifications in the left lung base. Bilateral bronchial wall thickening. Upper Abdomen: Gallstones.  No acute finding  Musculoskeletal: No acute osseous abnormality. Multilevel degenerative change IMPRESSION: 1. Moderate loculated right pleural effusion. Consolidation within the right middle and lower lobes with air bronchograms, findings are suspicious for pneumonia. Imaging follow-up to  resolution is recommended to exclude underlying mass. Punctate calcific densities in the lower lobes, could reflect small granulomas or sequela of previous aspiration. 2. Mildly enlarged mediastinal lymph nodes, likely reactive. 3. Gallstones. 4. Aortic atherosclerosis. Aortic Atherosclerosis (ICD10-I70.0). Electronically Signed   By: Esmeralda Hedge M.D.   On: 11/18/2023 17:58   DG Chest 2 View Result Date: 11/18/2023 CLINICAL DATA:  Back pain and shortness of breath. EXAM: CHEST - 2 VIEW COMPARISON:  None Available. FINDINGS: The cardiac silhouette is mildly enlarged. Low lung volumes are noted with mild to moderate severity right perihilar and marked severity right basilar atelectasis and/or infiltrate. Mild to moderate severity left basilar scarring, atelectasis and/or infiltrate is also seen. There is a large right pleural effusion with a large loculated component suspected along the periphery of the mid right lung. No pneumothorax is identified. The visualized skeletal structures are unremarkable. IMPRESSION: 1. Large right pleural effusion with a large loculated component suspected along the periphery of the mid right lung. Correlation with chest CT is recommended. 2. Right perihilar and bibasilar atelectasis and/or infiltrate. Further evaluation with chest CT is recommended, as sequelae associated with an underlying neoplastic process cannot be excluded. Electronically Signed   By: Virgle Grime M.D.   On: 11/18/2023 16:40     Procedures   Medications Ordered in the ED - No data to display                                  Medical Decision Making Amount and/or Complexity of Data Reviewed Labs: ordered. Radiology:  ordered.  Risk Decision regarding hospitalization.   Patient with right back pain, secondary to complicated right-sided pneumonia.  Patient with elevated white blood cell count, started on IV antibiotics.  He has been hypoxic, currently on 4 L nasal cannula.  Patient to be admitted for further management and workup.     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

## 2023-11-18 NOTE — H&P (Addendum)
 History and Physical    Jonathan Jimenez FMW:[id number] DOB: 31-Mar-1946 DOA: 11/18/2023  PCP: Larnell Hamilton, MD  Patient coming from: [facility]  I have personally briefly reviewed patient's old medical records in [facility]  Chief Complaint: right sided back pain severe x 48 hours associated with sob  HPI: Jonathan Jimenez is a 78 y.o. male with medical history significant of  Hypertension ,CKDIIIa, DMII,  HLD who presents to ED with complaint of right sided back pain with radiation from shoulder blade to under ribs. Patient notes pain is worse with deep inspiration and movement at times.  He notes he feels short of breath with the onset of pain. He notes no fever/chills/cough n/v/d / or chest pain .  ED Course:  On evaluation in ED patient was found to have pneumonia with loculated right pleural effusion.  BP 88/64 146/79, pulse 93, RR 30, temp 97.8 F, SpO2 80s.  EP.  Patient placed on 4 L O2 via [place] with improvement.   Labs showed WBC 26.8, hemoglobin 13.2, platelets 286, sodium 135, potassium 3.7, bicarb 22, BUN 35, creatinine 1.91, serum glucose 205, lactic acid 2.2, troponin T30 5 > 32, proBNP 399.  Blood cultures collected and pending.   CT chest without contrast showed moderate loculated right pleural effusion.  Consolidation within the right middle and lower lobes with air bronchograms suspicious for pneumonia.   Patient was given 1 L normal saline, IV ceftriaxone  and azithromycin .  The hospitalist service was consulted for admission.  Review of Systems: As per HPI otherwise 10 point review of systems negative.   Past Medical History:  Diagnosis Date   Hypertension     Past Surgical History:  Procedure Laterality Date   APPENDECTOMY       reports that he has never smoked. He has never used smokeless tobacco. He reports that he does not drink alcohol  and does not use drugs.  No Known Allergies  Family History  Problem Relation Age of Onset   Colon cancer Neg Hx    Diabetes  Mother    Prostate cancer Father     Prior to Admission medications   Medication Sig Start Date End Date Taking? Authorizing Provider  amLODipine (NORVASC) 10 MG tablet  09/13/14   [provider]  carvedilol  (COREG ) 25 MG tablet TAKE 1 TABLET BY MOUTH TWICE A DAY FOR HYPERTENSION    [provider]  doxycycline  (VIBRAMYCIN ) 100 MG capsule Take 1 capsule (100 mg total) by mouth 2 (two) times daily. 03/07/23   Mayer, Jodi R, NP  esomeprazole (NEXIUM) 20 MG capsule 1 capsule Orally Once a day    [provider]  simvastatin  (ZOCOR ) 20 MG tablet  09/13/14   [provider]  tamsulosin (FLOMAX) 0.4 MG CAPS capsule TAKE 1 CAPSULE BY MOUTH EVERYDAY AT BEDTIME    [provider]  triamterene-hydrochlorothiazide (MAXZIDE) 75-50 MG per tablet  09/13/14   [provider]  valsartan (DIOVAN) 320 MG tablet  09/13/14   [provider]    Physical Exam: Vitals:   11/18/23 2000 11/18/23 2015 11/18/23 2100 11/18/23 2145  BP: 127/68 123/80 (!) 109/56 (!) 146/79  Pulse: 90 95 94 (!) 102  Resp: (!) 26 (!) 21  (!) 26  Temp:    99.9 F (37.7 C)  TempSrc:    Oral  SpO2: 94% 94% 93% 93%  Weight:      Height:        Constitutional: NAD, calm, comfortable Vitals:   11/18/23  2000 11/18/23 2015 11/18/23 2100 11/18/23 2145  BP: 127/68 123/80 (!) 109/56 (!) 146/79  Pulse: 90 95 94 (!) 102  Resp: (!) 26 (!) 21  (!) 26  Temp:    99.9 F (37.7 C)  TempSrc:    Oral  SpO2: 94% 94% 93% 93%  Weight:      Height:       Eyes: PERRL, lids and conjunctivae normal ENMT: Mucous membranes are moist. Posterior pharynx clear of any exudate or lesions.Normal dentition.  Neck: normal, supple, no masses, no thyromegaly Respiratory: decrease bs , coughing with deep inspiration Normal respiratory effort. No accessory muscle use.  Cardiovascular: Regular rate and rhythm, no murmurs / rubs / gallops. No extremity edema. 2+ pedal pulses.  Abdomen: no tenderness,  no masses palpated. No hepatosplenomegaly. Bowel sounds positive.  Musculoskeletal: no clubbing / cyanosis. No joint deformity upper and lower extremities. Good ROM, no contractures. Normal muscle tone.  Skin: no rashes, lesions, ulcers. No induration Neurologic: CN 2-12 grossly intact. Sensation intact, . Strength 5/5 in all 4.  Psychiatric: Normal judgment and insight. Alert and oriented x 3. Normal mood.    Labs on Admission: I have personally reviewed following labs and imaging studies  CBC: Recent Labs  Lab 11/18/23 1515  WBC 26.8*  HGB 13.2  HCT 38.2*  MCV 89.5  PLT 286   Basic Metabolic Panel: Recent Labs  Lab 11/18/23 1515  NA 135  K 3.7  CL 95*  CO2 22  GLUCOSE 205*  BUN 35*  CREATININE 1.91*  CALCIUM 9.7   GFR: Estimated Creatinine Clearance: 39.4 mL/min (A) (by C-G formula based on SCr of 1.91 mg/dL (H)). Liver Function Tests: No results for input(s): AST, ALT, ALKPHOS, BILITOT, PROT, ALBUMIN in the last 168 hours. No results for input(s): LIPASE, AMYLASE in the last 168 hours. No results for input(s): AMMONIA in the last 168 hours. Coagulation Profile: No results for input(s): INR, PROTIME in the last 168 hours. Cardiac Enzymes: No results for input(s): CKTOTAL, CKMB, CKMBINDEX, TROPONINI in the last 168 hours. BNP (last 3 results) Recent Labs    11/18/23 1630  PROBNP 399.0*   HbA1C: No results for input(s): HGBA1C in the last 72 hours. CBG: No results for input(s): GLUCAP in the last 168 hours. Lipid Profile: No results for input(s): CHOL, HDL, LDLCALC, TRIG, CHOLHDL, LDLDIRECT in the last 72 hours. Thyroid  Function Tests: No results for input(s): TSH, T4TOTAL, FREET4, T3FREE, THYROIDAB in the last 72 hours. Anemia Panel: No results for input(s): VITAMINB12, FOLATE, FERRITIN, TIBC, IRON, RETICCTPCT in the last 72 hours. Urine analysis: No results found for: COLORURINE,  APPEARANCEUR, LABSPEC, PHURINE, GLUCOSEU, HGBUR, BILIRUBINUR, KETONESUR, PROTEINUR, UROBILINOGEN, NITRITE, LEUKOCYTESUR  Radiological Exams on Admission: CT Chest Wo Contrast Result Date: 11/18/2023 CLINICAL DATA:  Abnormal chest x-ray with back pain short of breath EXAM: CT CHEST WITHOUT CONTRAST TECHNIQUE: Multidetector CT imaging of the chest was performed following the standard protocol without IV contrast. RADIATION DOSE REDUCTION: This exam was performed according to the departmental dose-optimization program which includes automated exposure control, adjustment of the mA and/or kV according to patient size and/or use of iterative reconstruction technique. COMPARISON:  Chest x-ray 11/18/2023 FINDINGS: Cardiovascular: Limited evaluation without intravenous contrast. Moderate aortic atherosclerosis. Nonaneurysmal aorta. Borderline cardiomegaly. Trace pericardial effusion. Coronary vascular calcifications. Mediastinum/Nodes: Patent trachea. No suspicious thyroid  mass. Calcified mediastinal and hilar nodes consistent with prior granulomatous disease. Mildly enlarged paratracheal nodes measuring up to 14 mm. Enlarged subcarinal nodes measuring up to 18 mm. Esophagus  within normal limits. Lungs/Pleura: Moderate loculated right pleural effusion. Consolidation within the right middle and lower lobes with air bronchograms. Punctate calcific densities within the consolidated right lower lobe. Similar punctate calcifications in the left lung base. Bilateral bronchial wall thickening. Upper Abdomen: Gallstones.  No acute finding Musculoskeletal: No acute osseous abnormality. Multilevel degenerative change IMPRESSION: 1. Moderate loculated right pleural effusion. Consolidation within the right middle and lower lobes with air bronchograms, findings are suspicious for pneumonia. Imaging follow-up to resolution is recommended to exclude underlying mass. Punctate calcific densities in the lower lobes,  could reflect small granulomas or sequela of previous aspiration. 2. Mildly enlarged mediastinal lymph nodes, likely reactive. 3. Gallstones. 4. Aortic atherosclerosis. Aortic Atherosclerosis (ICD10-I70.0). Electronically Signed   By: Luke Bun M.D.   On: 11/18/2023 17:58   DG Chest 2 View Result Date: 11/18/2023 CLINICAL DATA:  Back pain and shortness of breath. EXAM: CHEST - 2 VIEW COMPARISON:  None Available. FINDINGS: The cardiac silhouette is mildly enlarged. Low lung volumes are noted with mild to moderate severity right perihilar and marked severity right basilar atelectasis and/or infiltrate. Mild to moderate severity left basilar scarring, atelectasis and/or infiltrate is also seen. There is a large right pleural effusion with a large loculated component suspected along the periphery of the mid right lung. No pneumothorax is identified. The visualized skeletal structures are unremarkable. IMPRESSION: 1. Large right pleural effusion with a large loculated component suspected along the periphery of the mid right lung. Correlation with chest CT is recommended. 2. Right perihilar and bibasilar atelectasis and/or infiltrate. Further evaluation with chest CT is recommended, as sequelae associated with an underlying neoplastic process cannot be excluded. Electronically Signed   By: Suzen Dials M.D.   On: 11/18/2023 16:40    EKG: Independently reviewed.   Assessment/Plan  CAP with loculated pleural effusion as well as associated Acute hypoxic respiratory failure as --ctx/ azithromycin ,  -pulmonary toilet  -urine ag, sputum, blood cultures f/u on culture data    Large -Moderate Right sided Loculated effusion - IR consult for evaluation re possible drainage  -monitor CRP and inflammatory markers  Hypertension -initially was hypotensive  -hold all HTN medication currently   Presumed AKI on CKDIIIa -cr 1.9  -hold nephrotoxic medications  - ivfs  -monitor urine output and labs     DMII - iss/fs  -A1c 6.6 - followed by pcp  with plan for initiation of GlP 1  - currently on metformin as outpatient  HLD -resume home regimen as able   DVT prophylaxis: heparin  Code Status: full/ as discussed per patient wishes in event of cardiac arrest  Family Communication: none at bedside Disposition Plan: patient  expected to be admitted greater than 2 midnights  Consults called: IR Admission status:  progressive care    Camila DELENA Ned MD [facility]   If 7PM-7AM, please contact night-coverage [email or web address] Password [facility]  11/18/2023, 10:05 PM

## 2023-11-18 NOTE — ED Notes (Signed)
 Patient updated on bed status and pending transfer to [facility]. Patient verbalized understanding and states no needs at this time.

## 2023-11-18 NOTE — Progress Notes (Signed)
 Plan of Care Note for accepted transfer   Patient: Jonathan Jimenez MRN: [id number]   DOA: 11/18/2023  Facility requesting transfer: [facility] Requesting Provider: Dr. Aldean Amass Reason for transfer: Sepsis and acute hypoxic respiratory failure due to pneumonia with loculated right pleural effusion Facility course:  Patient is a 78 year old male with history of T2DM, HTN, CKD stage IIIa, HLD who presented to the ED for evaluation of exertional dyspnea with right-sided pleuritic chest pain.  Initial vitals showed BP 190/65, pulse 93, RR 30, temp 97.8 F, SpO2 80s.  EP.  Patient placed on 4 L O2 via  with improvement.  Labs showed WBC 26.8, hemoglobin 13.2, platelets 286, sodium 135, potassium 3.7, bicarb 22, BUN 35, creatinine 1.91, serum glucose 205, lactic acid 2.2, troponin T30 5 > 32, proBNP 399.  Blood cultures collected and pending.  CT chest without contrast showed moderate loculated right pleural effusion.  Consolidation within the right middle and lower lobes with air bronchograms suspicious for pneumonia.  Patient was given 1 L normal saline, IV ceftriaxone and azithromycin.  The hospitalist service was consulted for admission.  Plan of care: The patient is accepted for admission to Progressive unit, at [facility] or [facility], first available.  Author: Edith Gores, MD 11/18/2023  Check [email or web address] for on-call coverage.  Nursing staff, Please call [facility] Admits & Consults System-Wide number on Amion as soon as patient's arrival, so appropriate admitting provider can evaluate the pt.

## 2023-11-19 ENCOUNTER — Inpatient Hospital Stay (HOSPITAL_COMMUNITY)

## 2023-11-19 DIAGNOSIS — J9601 Acute respiratory failure with hypoxia: Secondary | ICD-10-CM | POA: Diagnosis not present

## 2023-11-19 DIAGNOSIS — I10 Essential (primary) hypertension: Secondary | ICD-10-CM | POA: Diagnosis not present

## 2023-11-19 DIAGNOSIS — J90 Pleural effusion, not elsewhere classified: Secondary | ICD-10-CM | POA: Diagnosis not present

## 2023-11-19 DIAGNOSIS — E119 Type 2 diabetes mellitus without complications: Secondary | ICD-10-CM | POA: Diagnosis not present

## 2023-11-19 DIAGNOSIS — J189 Pneumonia, unspecified organism: Secondary | ICD-10-CM | POA: Diagnosis present

## 2023-11-19 DIAGNOSIS — N1831 Chronic kidney disease, stage 3a: Secondary | ICD-10-CM | POA: Diagnosis not present

## 2023-11-19 LAB — CBC
HCT: 36.9 % — ABNORMAL LOW (ref 39.0–52.0)
HCT: 38.7 % — ABNORMAL LOW (ref 39.0–52.0)
Hemoglobin: 12.1 g/dL — ABNORMAL LOW (ref 13.0–17.0)
Hemoglobin: 12.5 g/dL — ABNORMAL LOW (ref 13.0–17.0)
MCH: 30.4 pg (ref 26.0–34.0)
MCH: 30.6 pg (ref 26.0–34.0)
MCHC: 32.3 g/dL (ref 30.0–36.0)
MCHC: 32.8 g/dL (ref 30.0–36.0)
MCV: 92.7 fL (ref 80.0–100.0)
MCV: 94.6 fL (ref 80.0–100.0)
Platelets: 233 10*3/uL (ref 150–400)
Platelets: 237 10*3/uL (ref 150–400)
RBC: 3.98 MIL/uL — ABNORMAL LOW (ref 4.22–5.81)
RBC: 4.09 MIL/uL — ABNORMAL LOW (ref 4.22–5.81)
RDW: 13 % (ref 11.5–15.5)
RDW: 13.1 % (ref 11.5–15.5)
WBC: 21.3 10*3/uL — ABNORMAL HIGH (ref 4.0–10.5)
WBC: 22.2 10*3/uL — ABNORMAL HIGH (ref 4.0–10.5)
nRBC: 0 % (ref 0.0–0.2)
nRBC: 0 % (ref 0.0–0.2)

## 2023-11-19 LAB — COMPREHENSIVE METABOLIC PANEL WITH GFR
ALT: 14 U/L (ref 0–44)
AST: 15 U/L (ref 15–41)
Albumin: 2.7 g/dL — ABNORMAL LOW (ref 3.5–5.0)
Alkaline Phosphatase: 61 U/L (ref 38–126)
Anion gap: 13 (ref 5–15)
BUN: 30 mg/dL — ABNORMAL HIGH (ref 8–23)
CO2: 21 mmol/L — ABNORMAL LOW (ref 22–32)
Calcium: 8.3 mg/dL — ABNORMAL LOW (ref 8.9–10.3)
Chloride: 103 mmol/L (ref 98–111)
Creatinine, Ser: 1.48 mg/dL — ABNORMAL HIGH (ref 0.61–1.24)
GFR, Estimated: 49 mL/min — ABNORMAL LOW (ref >60)
Glucose, Bld: 159 mg/dL — ABNORMAL HIGH (ref 70–99)
Potassium: 3.4 mmol/L — ABNORMAL LOW (ref 3.5–5.1)
Sodium: 137 mmol/L (ref 135–145)
Total Bilirubin: 1.1 mg/dL (ref 0.0–1.2)
Total Protein: 6.3 g/dL — ABNORMAL LOW (ref 6.5–8.1)

## 2023-11-19 LAB — PROTIME-INR
INR: 1.6 — ABNORMAL HIGH (ref 0.8–1.2)
Prothrombin Time: 18.9 s — ABNORMAL HIGH (ref 11.4–15.2)

## 2023-11-19 LAB — HEMOGLOBIN A1C
Hgb A1c MFr Bld: 6.3 % — ABNORMAL HIGH (ref 4.8–5.6)
Mean Plasma Glucose: 134.11 mg/dL

## 2023-11-19 LAB — GLUCOSE, CAPILLARY
Glucose-Capillary: 193 mg/dL — ABNORMAL HIGH (ref 70–99)
Glucose-Capillary: 181 mg/dL — ABNORMAL HIGH (ref 70–99)
Glucose-Capillary: 150 mg/dL — ABNORMAL HIGH (ref 70–99)
Glucose-Capillary: 149 mg/dL — ABNORMAL HIGH (ref 70–99)

## 2023-11-19 LAB — LACTATE DEHYDROGENASE, PLEURAL OR PERITONEAL FLUID: LD, Fluid: >2500 U/L — ABNORMAL HIGH (ref 3–23)

## 2023-11-19 LAB — BODY FLUID CELL COUNT WITH DIFFERENTIAL
Eos, Fluid: 0 %
Lymphs, Fluid: 21 %
Monocyte-Macrophage-Serous Fluid: 11 % — ABNORMAL LOW (ref 50–90)
Neutrophil Count, Fluid: 68 % — ABNORMAL HIGH (ref 0–25)
Total Nucleated Cell Count, Fluid: 3509 uL — ABNORMAL HIGH (ref 0–1000)

## 2023-11-19 LAB — PROTEIN, PLEURAL OR PERITONEAL FLUID: Total protein, fluid: 4.6 g/dL

## 2023-11-19 LAB — C-REACTIVE PROTEIN: CRP: 38.1 mg/dL — ABNORMAL HIGH (ref <1.0)

## 2023-11-19 LAB — CREATININE, SERUM
Creatinine, Ser: 1.67 mg/dL — ABNORMAL HIGH (ref 0.61–1.24)
GFR, Estimated: 42 mL/min — ABNORMAL LOW (ref >60)

## 2023-11-19 LAB — GLUCOSE, PLEURAL OR PERITONEAL FLUID: Glucose, Fluid: <20 mg/dL

## 2023-11-19 LAB — PROCALCITONIN: Procalcitonin: 2.31 ng/mL

## 2023-11-19 LAB — LACTIC ACID, PLASMA
Lactic Acid, Venous: 1.2 mmol/L (ref 0.5–1.9)
Lactic Acid, Venous: 1.4 mmol/L (ref 0.5–1.9)

## 2023-11-19 MED ORDER — STERILE WATER FOR INJECTION IJ SOLN
5.0000 mg | Freq: Once | RESPIRATORY_TRACT | Status: AC
  Filled 2023-11-19: qty 5

## 2023-11-19 MED ORDER — SODIUM CHLORIDE 0.9% FLUSH
10.0000 mL | Freq: Three times a day (TID) | INTRAVENOUS | Status: DC
  Administered 2023-11-21 – 2023-11-23 (×5): 10 mL via INTRAPLEURAL

## 2023-11-19 MED ORDER — CARVEDILOL 25 MG PO TABS
25.0000 mg | ORAL_TABLET | Freq: Two times a day (BID) | ORAL | Status: DC
  Administered 2023-11-20 – 2023-11-26 (×14): 25 mg via ORAL
  Filled 2023-11-19 (×15): qty 1

## 2023-11-19 MED ORDER — SODIUM CHLORIDE 0.9% FLUSH
10.0000 mL | Freq: Three times a day (TID) | INTRAVENOUS | Status: DC
Start: 2023-11-19 — End: 2023-11-23
  Administered 2023-11-20 – 2023-11-23 (×11): 10 mL via INTRAPLEURAL

## 2023-11-19 MED ORDER — MIDAZOLAM HCL 2 MG/2ML IJ SOLN
INTRAMUSCULAR | Status: AC
  Filled 2023-11-19: qty 4

## 2023-11-19 MED ORDER — FENTANYL CITRATE (PF) 100 MCG/2ML IJ SOLN
INTRAMUSCULAR | Status: AC | PRN
  Administered 2023-11-19 (×2): 50 ug via INTRAVENOUS

## 2023-11-19 MED ORDER — MIDAZOLAM HCL 2 MG/2ML IJ SOLN
INTRAMUSCULAR | Status: AC | PRN
  Administered 2023-11-19 (×2): 1 mg via INTRAVENOUS

## 2023-11-19 MED ORDER — FENTANYL CITRATE (PF) 100 MCG/2ML IJ SOLN
INTRAMUSCULAR | Status: AC
  Filled 2023-11-19: qty 2

## 2023-11-19 MED ORDER — TRAMADOL HCL 50 MG PO TABS
50.0000 mg | ORAL_TABLET | Freq: Once | ORAL | Status: AC
  Filled 2023-11-19: qty 1

## 2023-11-19 MED ORDER — LIDOCAINE HCL 1 % IJ SOLN
INTRAMUSCULAR | Status: AC
  Filled 2023-11-19: qty 20

## 2023-11-19 MED ORDER — SODIUM CHLORIDE (PF) 0.9 % IJ SOLN
10.0000 mg | Freq: Once | INTRAMUSCULAR | Status: AC
  Administered 2023-11-19: 10 mg via INTRAPLEURAL
  Filled 2023-11-19: qty 10

## 2023-11-19 NOTE — TOC Initial Note (Signed)
 Transition of Care [facility]) - Initial/Assessment Note    Patient Details  Name: Jonathan Jimenez MRN: [id number] Date of Birth: [birth year]  Transition of Care [facility]) CM/SW Contact:    Ruben Corolla, RN Phone Number: 11/19/2023, 12:08 PM  Clinical Narrative: d/c plan home. Monitor on 02.                  Expected Discharge Plan: Home/Self Care Barriers to Discharge: Continued Medical Work up   Patient Goals and CMS Choice            Expected Discharge Plan and Services                                              Prior Living Arrangements/Services                       Activities of Daily Living   ADL Screening (condition at time of admission) Independently performs ADLs?: Yes (appropriate for developmental age) Is the patient deaf or have difficulty hearing?: No Does the patient have difficulty seeing, even when wearing glasses/contacts?: No Does the patient have difficulty concentrating, remembering, or making decisions?: No  Permission Sought/Granted                  Emotional Assessment              Admission diagnosis:  Pulmonary infiltrate [R91.8] Acute respiratory failure with hypoxia (HCC) [J96.01] Acute renal failure superimposed on stage 3a chronic kidney disease, unspecified acute renal failure type (HCC) [N17.9, N18.31] CAP (community acquired pneumonia) [J18.9] Patient Active Problem List   Diagnosis Date Noted   CAP (community acquired pneumonia) 11/19/2023   Acute respiratory failure with hypoxia (HCC) 11/18/2023   PCP:  Barnetta Liberty, MD Pharmacy:   [id number], [place] - [address] [place] [address] Phone: [phone] Fax: [phone]     Social Drivers of Health (SDOH) Social History: SDOH Screenings   Food Insecurity: No Food Insecurity (11/18/2023)  Housing: Low Risk  (11/18/2023)  Transportation Needs: No Transportation Needs (11/18/2023)  Utilities: Not  At Risk (11/18/2023)  Social Connections: Socially Integrated (11/18/2023)  Tobacco Use: Low Risk  (11/18/2023)   SDOH Interventions:     Readmission Risk Interventions     No data to display

## 2023-11-19 NOTE — Hospital Course (Signed)
 Jonathan Jimenez is a 78 y.o. male with a history of hypertension, CKD stage IIIa, diabetes mellitus type 2, hyperlipidemia.  Patient presented secondary to right sided back pain with associated shortness of breath and was found to have pneumonia with a right-sided loculated pleural effusion. Empiric antibiotics started. Pulmonology consulted for loculated effusion.

## 2023-11-19 NOTE — Progress Notes (Signed)
 MEDICATION-RELATED CONSULT NOTE   IR Procedure Consult - Anticoagulant/Antiplatelet PTA/Inpatient Med List Review by Pharmacist    Procedure: pleural drain placement    Completed: 6/19 @ 17:08  Post-Procedural bleeding risk per IR MD assessment: LOW  Antithrombotic medications on inpatient profile prior to procedure:  SQ heparin PTA antithrombotic medications:  none    Recommended restart time per IR Post-Procedure Guidelines:  at least 4 hrs and next standard dosing interval   Other considerations:  n/a   Plan:     SQ heparin orders never discontinued; scheduled for 2200 tonight. This is >4 hrs post-procedure, so will continue as ordered  Tera Fellows, PharmD, BCPS [phone] 11/19/2023, 5:32 PM

## 2023-11-19 NOTE — Procedures (Signed)
Procedure: instillation of pleural fibrinolytics Indication: loculated pleural effusion Description: 10mg of tPA in 30cc of saline and 5mg of dornase in 30cc of sterile water were injected into pleural space using existing pleural catheter.  30cc of saline were used to flush out dead space after.  Catheter will be clamped for 1 hour and then back to suction. Complications: none immediate  Tahiri Shareef MD  

## 2023-11-19 NOTE — Consult Note (Addendum)
   NAME:  Jonathan Jimenez, MRN:  [id number], DOB:  1946-02-14, LOS: 1 ADMISSION DATE:  11/18/2023, CONSULTATION DATE:  11/19/23  REFERRING MD:  Dr Duard Getting, CHIEF COMPLAINT:  loculated pleural effusion   History of Present Illness:  Asked to see patient for a loculated pleural effusion  He did have thoracentesis this morning 430 cc of hazy yellow fluid  Came in with a couple of days history of chest discomfort.  Had a chest x-ray and a CT scan of the chest showing a moderately large loculated pleural effusion with underlying pneumonia  History of diabetes, chronic kidney disease stage IIIa, hyperlipidemia  Usually healthy Never smoker Worked as a Music therapist Denies underlying lung disease  Pertinent  Medical History   Past Medical History:  Diagnosis Date   Hypertension     Significant Hospital Events: Including procedures, antibiotic start and stop dates in addition to other pertinent events   05/19/2024-CT scan of the chest showing loculated moderate effusion  Interim History / Subjective:  Feels relatively fine Tolerated thoracentesis well this morning  denies any chest pain or chest discomfort at present   Objective    Blood pressure (!) 157/70, pulse 99, temperature 99.1 F (37.3 C), temperature source Oral, resp. rate 18, height 5' 9 (1.753 m), weight 102.1 kg, SpO2 90%.    FiO2 (%):  [40 %] 40 %   Intake/Output Summary (Last 24 hours) at 11/19/2023 1254 Last data filed at 11/19/2023 0000 Gross per 24 hour  Intake 200.6 ml  Output --  Net 200.6 ml   Filed Weights   11/18/23 1450 11/18/23 2145  Weight: 102.1 kg 102.1 kg    Examination: General: Elderly,  appears comfortable HENT: Moist oral mucosa Lungs: Decreased air movement at the right base Cardiovascular: S1-S2 appreciated Abdomen: Soft, bowel sounds appreciated Extremities: No clubbing, no edema Neuro: Alert and oriented x 3 GU:   I reviewed last 24 h vitals and pain scores, last 48 h intake and output,  last 24 h labs and trends, and last 24 h imaging results.  Personally reviewed CT images  Resolved problem list   Assessment and Plan   Multilobar pneumonia of right lung Community-acquired pneumonia - Continue current antibiotic therapy, on azithromycin and ceftriaxone - Follow-up on cultures - Follow urinary antigens  Loculated pleural effusion  Fluid already sent for analysis - Patient will benefit from chest tube placement with intrapleural fibrinolytic  Chronic kidney disease stage IIIa -Avoid nephrotoxic medications - Maintain renal perfusion  Type 2 diabetes - Continue current treatment   Myer Artis, MD [facility] Pager: See Tilford Foley

## 2023-11-19 NOTE — Procedures (Signed)
 Pre procedural Dx: Right empyema Post procedural Dx: Same  Technically successful CT guided placed of a 10 Fr drainage catheter placement into the right pleural space yielding yellowish fluid.      EBL: Trace Complications: None immediate  Zettie Hillock, MD Pager #: [phone]

## 2023-11-19 NOTE — Procedures (Signed)
 Ultrasound-guided diagnostic and therapeutic right thoracentesis performed yielding 130 cc of  hazy, yellow fluid. No immediate complications. Follow-up chest x-ray pending. The fluid was sent to the lab for preordered studies. Despite catheter manipulation only the above amount of fluid could be aspirated due to the complex multiloculated nature of the pleural collection. EBL none. Consider pulmonology consult.

## 2023-11-19 NOTE — Consult Note (Signed)
 Chief Complaint: Back pain, dyspnea, complex/loculated right pleural effusion; referred for image guided right chest drain placement  Referring Provider(s): Olalere,A  Supervising Physician: Susan Ensign  Patient Status: [facility] - In-pt  History of Present Illness: Jonathan Jimenez is a 78 y.o. male with past medical history significant for hypertension, chronic kidney disease, diabetes, hyperlipidemia who recently presented to [facility] with right-sided back pain, dyspnea and finding of complex multiloculated right pleural effusion/?pneumonia on imaging.  Patient underwent right thoracentesis today yielding only 130 cc of hazy, yellow fluid.  Patient was seen by pulmonology and request now received for right chest drain placement.   Patient is Full Code  Past Medical History:  Diagnosis Date   Hypertension     Past Surgical History:  Procedure Laterality Date   APPENDECTOMY      Allergies: Patient has no known allergies.  Medications: Prior to Admission medications   Medication Sig Start Date End Date Taking? Authorizing Provider  amLODipine (NORVASC) 10 MG tablet Take 10 mg by mouth every evening. 09/13/14  Yes [provider]  B Complex Vitamins (VITAMIN B COMPLEX) TABS Take 1 tablet by mouth daily.   Yes [provider]  carvedilol (COREG) 25 MG tablet TAKE 1 TABLET BY MOUTH TWICE A DAY FOR HYPERTENSION   Yes [provider]  esomeprazole (NEXIUM) 20 MG capsule Take 20 mg by mouth daily as needed.   Yes [provider]  metFORMIN (GLUCOPHAGE) 500 MG tablet TAKE 1 TABLET WITH A MEAL BY MOUTH IN THE EVENING 03/13/23  Yes [provider]  Multiple Vitamin (MULTIVITAMIN WITH MINERALS) TABS tablet Take 1 tablet by mouth daily.   Yes [provider]  Multiple Vitamins-Minerals (ICAPS) TABS Take 1 tablet by mouth in the morning and at bedtime.   Yes [provider]  simvastatin (ZOCOR) 20 MG tablet Take 20 mg by  mouth every evening. 09/13/14  Yes [provider]  tamsulosin (FLOMAX) 0.4 MG CAPS capsule TAKE 1 CAPSULE BY MOUTH EVERYDAY AT BEDTIME   Yes [provider]  triamterene-hydrochlorothiazide (MAXZIDE) 75-50 MG per tablet Take 1 tablet by mouth daily. 09/13/14  Yes [provider]  doxycycline  (VIBRAMYCIN ) 100 MG capsule Take 1 capsule (100 mg total) by mouth 2 (two) times daily. Patient not taking: Reported on 11/18/2023 03/07/23   Alleen Arbour, NP     Family History  Problem Relation Age of Onset   Colon cancer Neg Hx    Diabetes Mother    Prostate cancer Father     Social History   Socioeconomic History   Marital status: Married    Spouse name: Not on file   Number of children: Not on file   Years of education: Not on file   Highest education level: Not on file  Occupational History   Not on file  Tobacco Use   Smoking status: Never   Smokeless tobacco: Never  Vaping Use   Vaping status: Never Used  Substance and Sexual Activity   Alcohol  use: No    Alcohol /week: 0.0 standard drinks of alcohol    Drug use: No   Sexual activity: Not on file  Other Topics Concern   Not on file  Social History Narrative   Not on file   Social Drivers of Health   Financial Resource Strain: Not on file  Food Insecurity: No Food Insecurity (11/18/2023)   Hunger Vital Sign    Worried About Running Out of Food in the Last Year: Never true  Ran Out of Food in the Last Year: Never true  Transportation Needs: No Transportation Needs (11/18/2023)   PRAPARE - Administrator, Civil Service (Medical): No    Lack of Transportation (Non-Medical): No  Physical Activity: Not on file  Stress: Not on file  Social Connections: Socially Integrated (11/18/2023)   Social Connection and Isolation Panel    Frequency of Communication with Friends and Family: More than three times a week    Frequency of Social Gatherings with Friends and Family: Twice a week    Attends  Religious Services: More than 4 times per year    Active Member of [employer] or Organizations: Yes    Attends Engineer, structural: More than 4 times per year    Marital Status: Married       Review of Systems: see above;  currently denies fever, headache, cough, abdominal pain, nausea, vomiting or bleeding.  Vital Signs: BP (!) 157/70 (BP Location: Left Arm)   Pulse 99   Temp 99.1 F (37.3 C) (Oral)   Resp 18   Ht 5' 9 (1.753 m)   Wt 225 lb 1.6 oz (102.1 kg)   SpO2 90%   BMI 33.24 kg/m   Advance Care Plan: No documents on file  Physical Exam, awake, alert.  Chest with diminished breath sounds right mid to lower lung field, left clear.  Heart with nl rate,  occasional ectopy.  Abdomen soft, positive bowel sounds, nontender. Trace pretibial edema right leg, no significant left lower extremity edema.  Imaging: DG Chest Port 1 View Result Date: 11/19/2023 CLINICAL DATA:  Status post thoracentesis. EXAM: PORTABLE CHEST 1 VIEW COMPARISON:  X-ray 11/18/2023 FINDINGS: Underinflation. Persistent large right effusion with adjacent opacity. No pneumothorax. Persistent linear opacity left lung base likely scar or atelectasis. Enlarged cardiopericardial silhouette. Vascular congestion. Overlapping cardiac leads. IMPRESSION: No pneumothorax seen post thoracentesis. Electronically Signed   By: Adrianna Horde M.D.   On: 11/19/2023 11:32   CT Chest Wo Contrast Result Date: 11/18/2023 CLINICAL DATA:  Abnormal chest x-ray with back pain short of breath EXAM: CT CHEST WITHOUT CONTRAST TECHNIQUE: Multidetector CT imaging of the chest was performed following the standard protocol without IV contrast. RADIATION DOSE REDUCTION: This exam was performed according to the departmental dose-optimization program which includes automated exposure control, adjustment of the mA and/or kV according to patient size and/or use of iterative reconstruction technique. COMPARISON:  Chest x-ray 11/18/2023 FINDINGS:  Cardiovascular: Limited evaluation without intravenous contrast. Moderate aortic atherosclerosis. Nonaneurysmal aorta. Borderline cardiomegaly. Trace pericardial effusion. Coronary vascular calcifications. Mediastinum/Nodes: Patent trachea. No suspicious thyroid  mass. Calcified mediastinal and hilar nodes consistent with prior granulomatous disease. Mildly enlarged paratracheal nodes measuring up to 14 mm. Enlarged subcarinal nodes measuring up to 18 mm. Esophagus within normal limits. Lungs/Pleura: Moderate loculated right pleural effusion. Consolidation within the right middle and lower lobes with air bronchograms. Punctate calcific densities within the consolidated right lower lobe. Similar punctate calcifications in the left lung base. Bilateral bronchial wall thickening. Upper Abdomen: Gallstones.  No acute finding Musculoskeletal: No acute osseous abnormality. Multilevel degenerative change IMPRESSION: 1. Moderate loculated right pleural effusion. Consolidation within the right middle and lower lobes with air bronchograms, findings are suspicious for pneumonia. Imaging follow-up to resolution is recommended to exclude underlying mass. Punctate calcific densities in the lower lobes, could reflect small granulomas or sequela of previous aspiration. 2. Mildly enlarged mediastinal lymph nodes, likely reactive. 3. Gallstones. 4. Aortic atherosclerosis. Aortic Atherosclerosis (ICD10-I70.0). Electronically Signed   By:  Esmeralda Hedge M.D.   On: 11/18/2023 17:58   DG Chest 2 View Result Date: 11/18/2023 CLINICAL DATA:  Back pain and shortness of breath. EXAM: CHEST - 2 VIEW COMPARISON:  None Available. FINDINGS: The cardiac silhouette is mildly enlarged. Low lung volumes are noted with mild to moderate severity right perihilar and marked severity right basilar atelectasis and/or infiltrate. Mild to moderate severity left basilar scarring, atelectasis and/or infiltrate is also seen. There is a large right pleural  effusion with a large loculated component suspected along the periphery of the mid right lung. No pneumothorax is identified. The visualized skeletal structures are unremarkable. IMPRESSION: 1. Large right pleural effusion with a large loculated component suspected along the periphery of the mid right lung. Correlation with chest CT is recommended. 2. Right perihilar and bibasilar atelectasis and/or infiltrate. Further evaluation with chest CT is recommended, as sequelae associated with an underlying neoplastic process cannot be excluded. Electronically Signed   By: Virgle Grime M.D.   On: 11/18/2023 16:40    Labs:  CBC: Recent Labs    11/18/23 1515 11/18/23 2358 11/19/23 0504  WBC 26.8* 21.3* 22.2*  HGB 13.2 12.1* 12.5*  HCT 38.2* 36.9* 38.7*  PLT 286 237 233    COAGS: Recent Labs    11/19/23 0504  INR 1.6*    BMP: Recent Labs    11/18/23 1515 11/18/23 2358 11/19/23 0504  NA 135  --  137  K 3.7  --  3.4*  CL 95*  --  103  CO2 22  --  21*  GLUCOSE 205*  --  159*  BUN 35*  --  30*  CALCIUM 9.7  --  8.3*  CREATININE 1.91* 1.67* 1.48*  GFRNONAA 36* 42* 49*    LIVER FUNCTION TESTS: Recent Labs    11/19/23 0504  BILITOT 1.1  AST 15  ALT 14  ALKPHOS 61  PROT 6.3*  ALBUMIN 2.7*    TUMOR MARKERS: No results for input(s): AFPTM, CEA, CA199, CHROMGRNA in the last 8760 hours.  Assessment and Plan: 78 y.o. male with past medical history significant for hypertension, chronic kidney disease, diabetes, hyperlipidemia who recently presented to [facility] with right-sided back pain, dyspnea and finding of complex multiloculated right pleural effusion/?pneumonia on imaging.  Patient underwent right thoracentesis today yielding only 130 cc of hazy, yellow fluid.  Patient was seen by pulmonology and request now received for right chest drain placement.  Imaging studies have been reviewed by Dr. Burna Carrier.Risks and benefits discussed with the patient including  bleeding, infection, damage to adjacent structures, pneumothorax, and sepsis.  All of the patient's questions were answered, patient is agreeable to proceed. Consent signed and in chart.    Thank you for allowing our service to participate in Ryoma Nofziger 's care.  Electronically Signed: D. Honore Lux, PA-C   11/19/2023, 1:09 PM      I spent a total of 40 Minutes    in face to face in clinical consultation, greater than 50% of which was counseling/coordinating care for image guided right chest drain placement

## 2023-11-19 NOTE — Progress Notes (Signed)
   11/19/23 1159  Chest Physiotherapy Tx  CPT Delivery Source Flutter valve  $ Expiratory Vibration Device Administration  Initial  $ Flutter Blue Yes  CPT Duration 10  CPT Chest Site Full range  Post-Treatment Respirations 20  Cough Non-productive  Position Supine  CPT Treatment Tolerance (S)  Tolerated poorly (Pt is afraid to cough due to back pain, RT encouraged deep breath and cough, non-compliant due to pain.)

## 2023-11-19 NOTE — Plan of Care (Signed)

## 2023-11-19 NOTE — Progress Notes (Signed)
 PROGRESS NOTE    Jonathan Jimenez  UJW:[id number] DOB: 1945-09-05 DOA: 11/18/2023 PCP: Barnetta Liberty, MD   Brief Narrative: Jonathan Jimenez is a 78 y.o. male with a history of hypertension, CKD stage IIIa, diabetes mellitus type 2, hyperlipidemia.  Patient presented secondary to right sided back pain with associated shortness of breath and was found to have pneumonia with a right-sided loculated pleural effusion. Empiric antibiotics started. Pulmonology consulted for loculated effusion.   Assessment and Plan:  Community acquired pneumonia Present on admission. Blood cultures obtained on admission. Patient started empirically on ceftriaxone and azithromycin. -Continue Ceftriaxone and azithromycin -Follow-up blood cultures  Right-sided loculated pleural effusion Likely secondary to pneumonia. IR consulted for thoracentesis but only yielded 130 mL of fluid; cultures and labs sent. -Follow-up pleural fluid labs -Pulmonology consultation for possible chest tube  Acute respiratory failure with hypoxia SpO2 down to 87% on room air. Patient placed on nasal canula. Rate increased to 5 L/min for ongoing hypoxia. -Continue oxygen therapy; if continues to worsen, will need to transfer to progressive care -Wean to room air as able -Incentive spirometer  Primary hypertension Noted. Patient is on amlodipine, triamterene-hydrochlorothiazide and Coreg as an outpatient. All medication held on admission. -Resume Coreg  AKI on CKD stage IIIa Unknown baseline creatinine, however, Creatinine of 1.91 on admission likely above baseline as he has a history of only stage IIIa disease. Triamterene-hydrochlorothiazide held on admission. Creatinine improved to 1.48.  Diabetes mellitus, type 2 Well controlled based on hemoglobin A1C of 6.3%. Patient is managed on metformin as an outpatient.  Hyperlipidemia -Continue simvastatin   DVT prophylaxis: Subcutaneous heparin Code Status:   Code Status: Full  Code Family Communication: None at bedside Disposition Plan: Discharge home likely not for several days pending ongoing management of pleural effusion   Consultants:  Pulmonology  Procedures:  Thoracentesis  Antimicrobials: Ceftriaxone Azithromycin    Subjective: Patient reports ongoing chest pain in the posterior right side of his chest. No significant dyspnea at rest.  Objective: BP (!) 157/70 (BP Location: Left Arm)   Pulse 99   Temp 99.1 F (37.3 C) (Oral)   Resp 18   Ht 5' 9 (1.753 m)   Wt 102.1 kg   SpO2 90%   BMI 33.24 kg/m   Examination:  General exam: Appears calm and comfortable Respiratory system: Diminished with rales. Respiratory effort normal. Cardiovascular system: S1 & S2 heard, RRR. No murmurs, rubs, gallops or clicks. Gastrointestinal system: Abdomen is nondistended, soft and nontender. Normal bowel sounds heard. Central nervous system: Alert and oriented. No focal neurological deficits. Psychiatry: Judgement and insight appear normal. Mood & affect appropriate.    Data Reviewed: I have personally reviewed following labs and imaging studies  CBC Lab Results  Component Value Date   WBC 22.2 (H) 11/19/2023   RBC 4.09 (L) 11/19/2023   HGB 12.5 (L) 11/19/2023   HCT 38.7 (L) 11/19/2023   MCV 94.6 11/19/2023   MCH 30.6 11/19/2023   PLT 233 11/19/2023   MCHC 32.3 11/19/2023   RDW 13.1 11/19/2023     Last metabolic panel Lab Results  Component Value Date   NA 137 11/19/2023   K 3.4 (L) 11/19/2023   CL 103 11/19/2023   CO2 21 (L) 11/19/2023   BUN 30 (H) 11/19/2023   CREATININE 1.48 (H) 11/19/2023   GLUCOSE 159 (H) 11/19/2023   GFRNONAA 49 (L) 11/19/2023   CALCIUM 8.3 (L) 11/19/2023   PROT 6.3 (L) 11/19/2023   ALBUMIN 2.7 (L) 11/19/2023  BILITOT 1.1 11/19/2023   ALKPHOS 61 11/19/2023   AST 15 11/19/2023   ALT 14 11/19/2023   ANIONGAP 13 11/19/2023    GFR: Estimated Creatinine Clearance: 50.8 mL/min (A) (by C-G formula based on  SCr of 1.48 mg/dL (H)).  Recent Results (from the past 240 hours)  Blood culture (routine x 2)     Status: None (Preliminary result)   Collection Time: 11/18/23  3:59 PM   Specimen: BLOOD  Result Value Ref Range Status   Specimen Description   Final    BLOOD LEFT ANTECUBITAL Performed at [facility], [address] [place], [place] [address]    Special Requests   Final    Blood Culture adequate volume BOTTLES DRAWN AEROBIC AND ANAEROBIC Performed at [facility], [address] [place], [place] [address]    Culture   Final    NO GROWTH < 12 HOURS Performed at [facility], [address], [place], [place] [address]    Report Status PENDING  Incomplete  Blood culture (routine x 2)     Status: None (Preliminary result)   Collection Time: 11/18/23  4:04 PM   Specimen: BLOOD  Result Value Ref Range Status   Specimen Description   Final    BLOOD RIGHT ANTECUBITAL Performed at [facility], [address] [place], [place] [address]    Special Requests   Final    Blood Culture results may not be optimal due to an inadequate volume of blood received in culture bottles BOTTLES DRAWN AEROBIC AND ANAEROBIC Performed at [facility], [address], [place], [place] [address]    Culture   Final    NO GROWTH < 12 HOURS Performed at [facility], [address] [place], [place] [address]    Report Status PENDING  Incomplete      Radiology Studies: DG Chest Port 1 View Result Date: 11/19/2023 CLINICAL DATA:  Status post thoracentesis. EXAM: PORTABLE CHEST 1 VIEW COMPARISON:  X-ray 11/18/2023 FINDINGS: Underinflation. Persistent large right effusion with adjacent opacity. No pneumothorax. Persistent linear opacity left lung base likely scar or atelectasis. Enlarged cardiopericardial silhouette. Vascular congestion. Overlapping cardiac leads. IMPRESSION: No pneumothorax seen post thoracentesis.  Electronically Signed   By: Adrianna Horde M.D.   On: 11/19/2023 11:32   CT Chest Wo Contrast Result Date: 11/18/2023 CLINICAL DATA:  Abnormal chest x-ray with back pain short of breath EXAM: CT CHEST WITHOUT CONTRAST TECHNIQUE: Multidetector CT imaging of the chest was performed following the standard protocol without IV contrast. RADIATION DOSE REDUCTION: This exam was performed according to the departmental dose-optimization program which includes automated exposure control, adjustment of the mA and/or kV according to patient size and/or use of iterative reconstruction technique. COMPARISON:  Chest x-ray 11/18/2023 FINDINGS: Cardiovascular: Limited evaluation without intravenous contrast. Moderate aortic atherosclerosis. Nonaneurysmal aorta. Borderline cardiomegaly. Trace pericardial effusion. Coronary vascular calcifications. Mediastinum/Nodes: Patent trachea. No suspicious thyroid  mass. Calcified mediastinal and hilar nodes consistent with prior granulomatous disease. Mildly enlarged paratracheal nodes measuring up to 14 mm. Enlarged subcarinal nodes measuring up to 18 mm. Esophagus within normal limits. Lungs/Pleura: Moderate loculated right pleural effusion. Consolidation within the right middle and lower lobes with air bronchograms. Punctate calcific densities within the consolidated right lower lobe. Similar punctate calcifications in the left lung base. Bilateral bronchial wall thickening. Upper Abdomen: Gallstones.  No acute finding Musculoskeletal: No acute osseous abnormality. Multilevel degenerative change IMPRESSION: 1. Moderate loculated right pleural effusion. Consolidation within the right middle  and lower lobes with air bronchograms, findings are suspicious for pneumonia. Imaging follow-up to resolution is recommended to exclude underlying mass. Punctate calcific densities in the lower lobes, could reflect small granulomas or sequela of previous aspiration. 2. Mildly enlarged mediastinal lymph  nodes, likely reactive. 3. Gallstones. 4. Aortic atherosclerosis. Aortic Atherosclerosis (ICD10-I70.0). Electronically Signed   By: Esmeralda Hedge M.D.   On: 11/18/2023 17:58   DG Chest 2 View Result Date: 11/18/2023 CLINICAL DATA:  Back pain and shortness of breath. EXAM: CHEST - 2 VIEW COMPARISON:  None Available. FINDINGS: The cardiac silhouette is mildly enlarged. Low lung volumes are noted with mild to moderate severity right perihilar and marked severity right basilar atelectasis and/or infiltrate. Mild to moderate severity left basilar scarring, atelectasis and/or infiltrate is also seen. There is a large right pleural effusion with a large loculated component suspected along the periphery of the mid right lung. No pneumothorax is identified. The visualized skeletal structures are unremarkable. IMPRESSION: 1. Large right pleural effusion with a large loculated component suspected along the periphery of the mid right lung. Correlation with chest CT is recommended. 2. Right perihilar and bibasilar atelectasis and/or infiltrate. Further evaluation with chest CT is recommended, as sequelae associated with an underlying neoplastic process cannot be excluded. Electronically Signed   By: Virgle Grime M.D.   On: 11/18/2023 16:40      LOS: 1 day    Aneita Keens, MD [facility] 11/19/2023, 12:25 PM   If 7PM-7AM, please contact night-coverage [email or web address]

## 2023-11-19 NOTE — Plan of Care (Signed)

## 2023-11-20 ENCOUNTER — Inpatient Hospital Stay (HOSPITAL_COMMUNITY)

## 2023-11-20 DIAGNOSIS — J90 Pleural effusion, not elsewhere classified: Secondary | ICD-10-CM

## 2023-11-20 DIAGNOSIS — J189 Pneumonia, unspecified organism: Secondary | ICD-10-CM | POA: Diagnosis not present

## 2023-11-20 DIAGNOSIS — J869 Pyothorax without fistula: Secondary | ICD-10-CM

## 2023-11-20 LAB — CBC
HCT: 35.2 % — ABNORMAL LOW (ref 39.0–52.0)
Hemoglobin: 11.7 g/dL — ABNORMAL LOW (ref 13.0–17.0)
MCH: 30.5 pg (ref 26.0–34.0)
MCHC: 33.2 g/dL (ref 30.0–36.0)
MCV: 91.9 fL (ref 80.0–100.0)
Platelets: 269 10*3/uL (ref 150–400)
RBC: 3.83 MIL/uL — ABNORMAL LOW (ref 4.22–5.81)
RDW: 13.3 % (ref 11.5–15.5)
WBC: 17.9 10*3/uL — ABNORMAL HIGH (ref 4.0–10.5)
nRBC: 0 % (ref 0.0–0.2)

## 2023-11-20 LAB — GLUCOSE, CAPILLARY
Glucose-Capillary: 157 mg/dL — ABNORMAL HIGH (ref 70–99)
Glucose-Capillary: 185 mg/dL — ABNORMAL HIGH (ref 70–99)
Glucose-Capillary: 174 mg/dL — ABNORMAL HIGH (ref 70–99)
Glucose-Capillary: 188 mg/dL — ABNORMAL HIGH (ref 70–99)

## 2023-11-20 LAB — BASIC METABOLIC PANEL WITH GFR
Anion gap: 14 (ref 5–15)
BUN: 29 mg/dL — ABNORMAL HIGH (ref 8–23)
CO2: 22 mmol/L (ref 22–32)
Calcium: 8.1 mg/dL — ABNORMAL LOW (ref 8.9–10.3)
Chloride: 100 mmol/L (ref 98–111)
Creatinine, Ser: 1.22 mg/dL (ref 0.61–1.24)
GFR, Estimated: >60 mL/min (ref >60)
Glucose, Bld: 163 mg/dL — ABNORMAL HIGH (ref 70–99)
Potassium: 3.4 mmol/L — ABNORMAL LOW (ref 3.5–5.1)
Sodium: 136 mmol/L (ref 135–145)

## 2023-11-20 LAB — CYTOLOGY - NON PAP

## 2023-11-20 LAB — CULTURE, BLOOD (ROUTINE X 2)

## 2023-11-20 MED ORDER — IPRATROPIUM-ALBUTEROL 0.5-2.5 (3) MG/3ML IN SOLN
3.0000 mL | Freq: Two times a day (BID) | RESPIRATORY_TRACT | Status: DC
  Administered 2023-11-20 – 2023-11-22 (×4): 3 mL via RESPIRATORY_TRACT
  Filled 2023-11-20 (×4): qty 3

## 2023-11-20 MED ORDER — SODIUM CHLORIDE (PF) 0.9 % IJ SOLN
10.0000 mg | Freq: Once | INTRAMUSCULAR | Status: AC
  Administered 2023-11-20: 10 mg via INTRAPLEURAL
  Filled 2023-11-20: qty 10

## 2023-11-20 MED ORDER — POLYETHYLENE GLYCOL 3350 17 G PO PACK
17.0000 g | PACK | Freq: Every day | ORAL | Status: DC
  Administered 2023-11-20 – 2023-11-22 (×3): 17 g via ORAL
  Filled 2023-11-20 (×6): qty 1

## 2023-11-20 MED ORDER — STERILE WATER FOR INJECTION IJ SOLN
5.0000 mg | Freq: Once | RESPIRATORY_TRACT | Status: AC
  Administered 2023-11-20: 5 mg via INTRAPLEURAL
  Filled 2023-11-20: qty 5

## 2023-11-20 MED ORDER — ORAL CARE MOUTH RINSE: 15.0000 mL | OROMUCOSAL | Status: DC | PRN

## 2023-11-20 NOTE — Progress Notes (Signed)
 PT Cancellation Note  Patient Details Name: Jonathan Jimenez MRN: [id number] DOB: [birth year]   Cancelled Treatment:    Reason Eval/Treat Not Completed: Fatigue/lethargy limiting ability to participate (pt with dyspnea and expiratory wheezing at rest in bed. He stated he's too fatigued to attempt any mobility at present. Will follow.)   Daymon Evans PT 11/20/2023  Acute Rehabilitation Services  Office [phone]

## 2023-11-20 NOTE — Plan of Care (Signed)
  Problem: Education: Goal: Knowledge of General Education information will improve Description: Including pain rating scale, medication(s)/side effects and non-pharmacologic comfort measures Outcome: Progressing   Problem: Clinical Measurements: Goal: Diagnostic test results will improve Outcome: Progressing Goal: Respiratory complications will improve Outcome: Progressing Goal: Cardiovascular complication will be avoided Outcome: Progressing   Problem: Activity: Goal: Risk for activity intolerance will decrease Outcome: Progressing   Problem: Nutrition: Goal: Adequate nutrition will be maintained Outcome: Progressing   Problem: Coping: Goal: Level of anxiety will decrease Outcome: Progressing   Problem: Elimination: Goal: Will not experience complications related to bowel motility Outcome: Progressing   Problem: Pain Managment: Goal: General experience of comfort will improve and/or be controlled Outcome: Progressing   Problem: Safety: Goal: Ability to remain free from injury will improve Outcome: Progressing   Problem: Activity: Goal: Ability to tolerate increased activity will improve Outcome: Progressing   Problem: Respiratory: Goal: Ability to maintain adequate ventilation will improve Outcome: Progressing Goal: Ability to maintain a clear airway will improve Outcome: Progressing

## 2023-11-20 NOTE — Progress Notes (Addendum)
 PROGRESS NOTE    Kashmir Lysaght  JXB:[id number] DOB: [birth year] DOA: 11/18/2023 PCP: Barnetta Liberty, MD   Brief Narrative: Mustaf Antonacci is a 78 y.o. male with a history of hypertension, CKD stage IIIa, diabetes mellitus type 2, hyperlipidemia.  Patient presented secondary to right sided back pain with associated shortness of breath and was found to have pneumonia with a right-sided loculated pleural effusion. Empiric antibiotics started. Pulmonology consulted for loculated effusion and consulted IR for right chest tube placement, which was successfully performed on 6/19.   Assessment and Plan:  Community acquired pneumonia Present on admission. Blood cultures obtained on admission. Patient started empirically on ceftriaxone and azithromycin. Blood cultures with no growth to date. Leukocytosis improving. -Continue Ceftriaxone and azithromycin -Follow-up blood cultures  Severe sepsis Present on admission. Secondary to pneumonia.  Right-sided loculated pleural effusion Likely secondary to pneumonia. IR consulted for thoracentesis but only yielded 130 mL of fluid; cultures and labs sent. Pulmonology consulted and consulted IR for right chest tube placement, which was performed on 6/19. -Follow-up pleural fluid labs -Pulmonology recommendations: chest tube management  Acute respiratory failure with hypoxia SpO2 down to 87% on room air. Patient placed on nasal canula. Rate increased to 5 L/min for ongoing hypoxia. -Continue oxygen therapy; if continues to worsen, will need to transfer to progressive care -Wean to room air as able -Incentive spirometer -PT/OT  Primary hypertension Noted. Patient is on amlodipine, triamterene-hydrochlorothiazide and Coreg as an outpatient. All medication held on admission. -Continue Coreg  AKI on CKD stage IIIa Unknown baseline creatinine, however, Creatinine of 1.91 on admission likely above baseline as he has a history of only stage IIIa disease.  Triamterene-hydrochlorothiazide held on admission. Creatinine improved to 1.22. Resolved.  Diabetes mellitus, type 2 Well controlled based on hemoglobin A1C of 6.3%. Patient is managed on metformin as an outpatient.  Hyperlipidemia -Continue simvastatin  Hypokalemia -Potassium supplementation   DVT prophylaxis: Subcutaneous heparin Code Status:   Code Status: Full Code Family Communication: None at bedside Disposition Plan: Discharge home likely not for several days pending ongoing management of pleural effusion   Consultants:  Pulmonology  Procedures:  Thoracentesis Right chest tube placement  Antimicrobials: Ceftriaxone Azithromycin    Subjective: Patient feels slightly better. Thinks he has more energy than previously. Afebrile overnight.  Objective: BP 136/61 (BP Location: Right Arm)   Pulse 91   Temp 99.6 F (37.6 C) (Oral)   Resp 15   Ht 5' 9 (1.753 m)   Wt 102.1 kg   SpO2 90%   BMI 33.24 kg/m   Examination:  General exam: Appears calm and comfortable Respiratory system: Diminished with rales. Respiratory effort normal. Cardiovascular system: S1 & S2 heard, RRR. No murmurs, rubs, gallops or clicks. Gastrointestinal system: Abdomen is nondistended, soft and nontender. Normal bowel sounds heard. Central nervous system: Alert and oriented. No focal neurological deficits. Psychiatry: Judgement and insight appear normal. Mood & affect appropriate.    Data Reviewed: I have personally reviewed following labs and imaging studies  CBC Lab Results  Component Value Date   WBC 17.9 (H) 11/20/2023   RBC 3.83 (L) 11/20/2023   HGB 11.7 (L) 11/20/2023   HCT 35.2 (L) 11/20/2023   MCV 91.9 11/20/2023   MCH 30.5 11/20/2023   PLT 269 11/20/2023   MCHC 33.2 11/20/2023   RDW 13.3 11/20/2023     Last metabolic panel Lab Results  Component Value Date   NA 136 11/20/2023   K 3.4 (L) 11/20/2023   CL 100 11/20/2023  CO2 22 11/20/2023   BUN 29 (H) 11/20/2023    CREATININE 1.22 11/20/2023   GLUCOSE 163 (H) 11/20/2023   GFRNONAA >60 11/20/2023   CALCIUM 8.1 (L) 11/20/2023   PROT 6.3 (L) 11/19/2023   ALBUMIN 2.7 (L) 11/19/2023   BILITOT 1.1 11/19/2023   ALKPHOS 61 11/19/2023   AST 15 11/19/2023   ALT 14 11/19/2023   ANIONGAP 14 11/20/2023    GFR: Estimated Creatinine Clearance: 61.6 mL/min (by C-G formula based on SCr of 1.22 mg/dL).  Recent Results (from the past 240 hours)  Blood culture (routine x 2)     Status: None (Preliminary result)   Collection Time: 11/18/23  3:59 PM   Specimen: BLOOD  Result Value Ref Range Status   Specimen Description   Final    BLOOD LEFT ANTECUBITAL Performed at [facility], [address] [place], [place] [address]    Special Requests   Final    Blood Culture adequate volume BOTTLES DRAWN AEROBIC AND ANAEROBIC Performed at [facility], [address] [place], [place] [address]    Culture   Final    NO GROWTH 2 DAYS Performed at [facility], [address], [place], [place] [address]    Report Status PENDING  Incomplete  Blood culture (routine x 2)     Status: None (Preliminary result)   Collection Time: 11/18/23  4:04 PM   Specimen: BLOOD  Result Value Ref Range Status   Specimen Description   Final    BLOOD RIGHT ANTECUBITAL Performed at [facility], [address], [place], [place] [address]    Special Requests   Final    Blood Culture results may not be optimal due to an inadequate volume of blood received in culture bottles BOTTLES DRAWN AEROBIC AND ANAEROBIC Performed at [facility], [address] [place], [place] [address]    Culture   Final    NO GROWTH 2 DAYS Performed at [facility], [address] [place], [place] [address]    Report Status PENDING  Incomplete  Body fluid culture w Gram Stain     Status: None (Preliminary result)   Collection Time: 11/19/23 11:08 AM   Specimen:  Pleura; Body Fluid  Result Value Ref Range Status   Specimen Description   Final    PLEURAL Performed at [facility], [address] [place], [place] [address]    Special Requests   Final    RIGHT Performed at [facility], [address] [place], [place] [address]    Gram Stain   Final    RARE WBC PRESENT, PREDOMINANTLY PMN NO ORGANISMS SEEN Performed at [facility], [address], [place], [place] [address]    Culture PENDING  Incomplete   Report Status PENDING  Incomplete      Radiology Studies: CT [facility] PLEURAL DRAIN W/INDWELL CATH W/IMG GUIDE Result Date: 11/19/2023 Rosetta Cons, MD     11/19/2023  5:08 PM Pre procedural Dx: Right empyema Post procedural Dx: Same Technically successful CT guided placed of a 10 Fr drainage catheter placement into the right pleural space yielding yellowish fluid.  EBL: Trace Complications: None immediate Zettie Hillock, MD Pager #: [phone]   US  THORACENTESIS ASP PLEURAL SPACE W/IMG GUIDE Result Date: 11/19/2023 INDICATION: Patient with history of right-sided back pain, dyspnea, pneumonia, complex/ loculated right pleural effusion; request received for diagnostic and therapeutic right thoracentesis EXAM: ULTRASOUND GUIDED DIAGNOSTIC AND THERAPEUTIC RIGHT  THORACENTESIS MEDICATIONS: 8 mL 1% lidocaine COMPLICATIONS: None immediate. PROCEDURE: An ultrasound guided thoracentesis was thoroughly discussed with the patient and questions answered. The benefits, risks, alternatives and complications were also discussed. The patient understands and wishes to proceed with the procedure. Written consent was obtained. Ultrasound was performed to localize and mark an adequate pocket of fluid in the right chest. The area was then prepped and draped in the normal sterile fashion. 1% Lidocaine was used for local anesthesia. Under ultrasound guidance a 6 Fr Safe-T-Centesis catheter was introduced. Thoracentesis was performed.  The catheter was removed and a dressing applied. FINDINGS: A total of approximately 130 cc of hazy, yellow fluid was removed. Samples were sent to the laboratory as requested by the clinical team. Despite catheter manipulation only the above amount of fluid could be aspirated due to the complex multiloculated nature of the pleural collection. IMPRESSION: Successful ultrasound guided diagnostic and therapeutic right thoracentesis yielding 130 cc of pleural fluid. Performed by: Honore Lux, PA-C Electronically Signed   By: Susan Ensign   On: 11/19/2023 14:17   DG Chest Port 1 View Result Date: 11/19/2023 CLINICAL DATA:  Status post thoracentesis. EXAM: PORTABLE CHEST 1 VIEW COMPARISON:  X-ray 11/18/2023 FINDINGS: Underinflation. Persistent large right effusion with adjacent opacity. No pneumothorax. Persistent linear opacity left lung base likely scar or atelectasis. Enlarged cardiopericardial silhouette. Vascular congestion. Overlapping cardiac leads. IMPRESSION: No pneumothorax seen post thoracentesis. Electronically Signed   By: Adrianna Horde M.D.   On: 11/19/2023 11:32   CT Chest Wo Contrast Result Date: 11/18/2023 CLINICAL DATA:  Abnormal chest x-ray with back pain short of breath EXAM: CT CHEST WITHOUT CONTRAST TECHNIQUE: Multidetector CT imaging of the chest was performed following the standard protocol without IV contrast. RADIATION DOSE REDUCTION: This exam was performed according to the departmental dose-optimization program which includes automated exposure control, adjustment of the mA and/or kV according to patient size and/or use of iterative reconstruction technique. COMPARISON:  Chest x-ray 11/18/2023 FINDINGS: Cardiovascular: Limited evaluation without intravenous contrast. Moderate aortic atherosclerosis. Nonaneurysmal aorta. Borderline cardiomegaly. Trace pericardial effusion. Coronary vascular calcifications. Mediastinum/Nodes: Patent trachea. No suspicious thyroid  mass. Calcified  mediastinal and hilar nodes consistent with prior granulomatous disease. Mildly enlarged paratracheal nodes measuring up to 14 mm. Enlarged subcarinal nodes measuring up to 18 mm. Esophagus within normal limits. Lungs/Pleura: Moderate loculated right pleural effusion. Consolidation within the right middle and lower lobes with air bronchograms. Punctate calcific densities within the consolidated right lower lobe. Similar punctate calcifications in the left lung base. Bilateral bronchial wall thickening. Upper Abdomen: Gallstones.  No acute finding Musculoskeletal: No acute osseous abnormality. Multilevel degenerative change IMPRESSION: 1. Moderate loculated right pleural effusion. Consolidation within the right middle and lower lobes with air bronchograms, findings are suspicious for pneumonia. Imaging follow-up to resolution is recommended to exclude underlying mass. Punctate calcific densities in the lower lobes, could reflect small granulomas or sequela of previous aspiration. 2. Mildly enlarged mediastinal lymph nodes, likely reactive. 3. Gallstones. 4. Aortic atherosclerosis. Aortic Atherosclerosis (ICD10-I70.0). Electronically Signed   By: Esmeralda Hedge M.D.   On: 11/18/2023 17:58   DG Chest 2 View Result Date: 11/18/2023 CLINICAL DATA:  Back pain and shortness of breath. EXAM: CHEST - 2 VIEW COMPARISON:  None Available. FINDINGS: The cardiac silhouette is mildly enlarged. Low lung volumes are noted with mild to moderate severity right perihilar and marked severity right basilar atelectasis and/or infiltrate. Mild to moderate severity left basilar scarring, atelectasis and/or infiltrate is also seen. There is a large  right pleural effusion with a large loculated component suspected along the periphery of the mid right lung. No pneumothorax is identified. The visualized skeletal structures are unremarkable. IMPRESSION: 1. Large right pleural effusion with a large loculated component suspected along the  periphery of the mid right lung. Correlation with chest CT is recommended. 2. Right perihilar and bibasilar atelectasis and/or infiltrate. Further evaluation with chest CT is recommended, as sequelae associated with an underlying neoplastic process cannot be excluded. Electronically Signed   By: Virgle Grime M.D.   On: 11/18/2023 16:40      LOS: 2 days    Aneita Keens, MD [facility] 11/20/2023, 9:21 AM   If 7PM-7AM, please contact night-coverage [email or web address]

## 2023-11-20 NOTE — Progress Notes (Signed)
   NAME:  Jonathan Jimenez, MRN:  [id number], DOB:  [birth year], LOS: 2 ADMISSION DATE:  11/18/2023, CONSULTATION DATE:  11/19/23  REFERRING MD:  Dr Duard Getting, CHIEF COMPLAINT:  loculated pleural effusion   History of Present Illness:  Asked to see patient for a loculated pleural effusion  He had thoracentesis 6/19 430 cc of hazy yellow fluid  Came in with a couple of days history of chest discomfort.  Had a chest x-ray and a CT scan of the chest showing a moderately large loculated pleural effusion with underlying pneumonia    Usually healthy Never smoker Worked as a Music therapist Denies underlying lung disease  Pertinent  Medical History   diabetes, chronic kidney disease stage IIIa, hyperlipidemia   Significant Hospital Events: Including procedures, antibiotic start and stop dates in addition to other pertinent events   11/18/2023-CT scan of the chest showing loculated moderate effusion 6/19 lytics x 1  Interim History / Subjective:   Denies chest pain Afebrile 300 cc drainage after Lasix administration yesterday, another 150 cc this morning   Objective    Blood pressure 136/61, pulse 91, temperature 99.6 F (37.6 C), temperature source Oral, resp. rate 15, height 5' 9 (1.753 m), weight 102.1 kg, SpO2 90%.    FiO2 (%):  [40 %] 40 %   Intake/Output Summary (Last 24 hours) at 11/20/2023 0857 Last data filed at 11/20/2023 0500 Gross per 24 hour  Intake 3086.25 ml  Output 1200 ml  Net 1886.25 ml   Filed Weights   11/18/23 1450 11/18/23 2145  Weight: 102.1 kg 102.1 kg    Examination: General: Elderly,  appears comfortable HENT: Moist oral mucosa Lungs: Decreased breath sounds right base, no accessory muscle use, orange/yellow fluid draining from chest Cardiovascular: S1-S2 regular, no murmur Abdomen: Soft, bowel sounds appreciated Extremities: No clubbing, no edema Neuro: Alert and oriented x 3 GU:   Labs show decreasing leukocytosis, stable anemia, mild  hypokalemia  Resolved problem list   Assessment and Plan   Multilobar pneumonia of right lung Community-acquired pneumonia - Continue azithromycin and ceftriaxone - Cultures negative, check urine strep antigen not sent so far  Right empyema -status post lytics x 1 with good drainage Will give lytics again today, trying to avoid VATS here Fluid culture negative so far Flush pigtail per protocol  Celene Coins MD. FCCP. [facility] Pager : [phone]  If no response to pager , please call [phone] until 7 pm After 7:00 pm call Elink  [phone]   11/20/2023

## 2023-11-20 NOTE — Progress Notes (Signed)
 Referring Physician(s): Leata Providence  Supervising Physician: Alyssa Jumper  Patient Status:  Jonathan Jimenez - In-pt  Chief Complaint: Back pain, dyspnea, complex/loculated right pleural effusion ; status post right chest drain placement on 11/19/2023   Subjective: Patient on bedside commode, states his breathing is a little bit better, does have some persistent back pain and dyspnea; appears fatigued   Allergies: Patient has no known allergies.  Medications: Prior to Admission medications   Medication Sig Start Date End Date Taking? Authorizing Provider  amLODipine (NORVASC) 10 MG tablet Take 10 mg by mouth every evening. 09/13/14  Yes [provider]  B Complex Vitamins (VITAMIN B COMPLEX) TABS Take 1 tablet by mouth daily.   Yes [provider]  carvedilol (COREG) 25 MG tablet TAKE 1 TABLET BY MOUTH TWICE A DAY FOR HYPERTENSION   Yes [provider]  esomeprazole (NEXIUM) 20 MG capsule Take 20 mg by mouth daily as needed.   Yes [provider]  metFORMIN (GLUCOPHAGE) 500 MG tablet TAKE 1 TABLET WITH A MEAL BY MOUTH IN THE EVENING 03/13/23  Yes [provider]  Multiple Vitamin (MULTIVITAMIN WITH MINERALS) TABS tablet Take 1 tablet by mouth daily.   Yes [provider]  Multiple Vitamins-Minerals (ICAPS) TABS Take 1 tablet by mouth in the morning and at bedtime.   Yes [provider]  simvastatin (ZOCOR) 20 MG tablet Take 20 mg by mouth every evening. 09/13/14  Yes [provider]  tamsulosin (FLOMAX) 0.4 MG CAPS capsule TAKE 1 CAPSULE BY MOUTH EVERYDAY AT BEDTIME   Yes [provider]  triamterene-hydrochlorothiazide (MAXZIDE) 75-50 MG per tablet Take 1 tablet by mouth daily. 09/13/14  Yes [provider]  doxycycline  (VIBRAMYCIN ) 100 MG capsule Take 1 capsule (100 mg total) by mouth 2 (two) times daily. Patient not taking: Reported on 11/18/2023 03/07/23   Alleen Arbour, NP     Vital Signs: BP 136/61  (BP Location: Right Arm)   Pulse 91   Temp 99.6 F (37.6 C) (Oral)   Resp 15   Ht 5' 9 (1.753 m)   Wt 225 lb 1.6 oz (102.1 kg)   SpO2 90%   BMI 33.24 kg/m   Physical Exam awake, alert.  Right upper posterior chest drain intact, drain to Pleur-evac and wall suction.  Hazy yellow fluid in Pleur-evac.  Increased output since fibrinolysis  Imaging: DG Chest Port 1 View Result Date: 11/20/2023 CLINICAL DATA:  Right-sided pleural effusion and chest tube EXAM: PORTABLE CHEST 1 VIEW COMPARISON:  Yesterday FINDINGS: Right pleural pigtail catheter is been placed in the interval. Patient rotated to the left. The Chin overlies the upper chest. Moderate cardiomegaly. Small to moderate right pleural effusion, decreased. No pneumothorax. Mild interstitial edema. Relatively similar right sided airspace disease, relatively diffuse with sparing of the right apex. IMPRESSION: Placement of a right-sided pleural pigtail catheter with decrease in right-sided pleural fluid but persistent relatively diffuse right-sided airspace disease. Cardiomegaly with mild pulmonary venous congestion. Electronically Signed   By: Lore Rode M.D.   On: 11/20/2023 10:09   CT [name] PLEURAL DRAIN W/INDWELL CATH W/IMG GUIDE Result Date: 11/20/2023 INDICATION: Right-sided empyema planned placement of a right-sided pleural drain for therapy. EXAM: CT-guided chest tube placement TECHNIQUE: Multidetector CT imaging of the chest was performed following the standard protocol without IV contrast. RADIATION DOSE REDUCTION: This exam was performed according to the departmental dose-optimization program which includes automated exposure control, adjustment of the mA and/or kV according to patient size and/or use  of iterative reconstruction technique. MEDICATIONS: The patient is currently admitted to the hospital and receiving intravenous antibiotics. The antibiotics were administered within an appropriate time frame prior to the initiation of the  procedure. ANESTHESIA/SEDATION: Moderate (conscious) sedation was employed during this procedure. A total of Versed 2 mg and Fentanyl 100 mcg was administered intravenously by the radiology nurse. Total intra-service moderate Sedation Time: 16 minutes. The patient's level of consciousness and vital signs were monitored continuously by radiology nursing throughout the procedure under my direct supervision. COMPLICATIONS: None immediate. PROCEDURE: Informed written consent was obtained from the patient after a thorough discussion of the procedural risks, benefits and alternatives. All questions were addressed. Maximal Sterile Barrier Technique was utilized including caps, mask, sterile gowns, sterile gloves, sterile drape, hand hygiene and skin antiseptic. A timeout was performed prior to the initiation of the procedure. The patient was placed in left lateral decubitus position on the CT gantry. Initial axial images of the chest were obtained to delineate the areas of the empyema and targeting. Radiopaque markers were then placed on the patient's chest and further imaging was performed. The patient was then marked, prepped, and draped in the usual sterile fashion. Local anesthesia was achieved with 1% lidocaine. A stab incision was then made in the right axillary region and a 10 cm Yueh needle was advanced from the incision to the pleural space with intermittent axial imaging of the chest verified needle position. Once in the pleural cavity, the needle was removed leaving the cannula in position. A short Amplatz wire was then advanced into the pleural space. Access was then exchanged over the guidewire for a new 10 French pigtail catheter. The catheter was advanced into the pleural space. The stiffener was removed, the locking color engaged, and fluid was withdrawn. The catheter was then connected to suction. Retention suture and sterile dressing applied. IMPRESSION: Satisfactory place mint of a 10 French pigtail  drainage catheter in the right pleural space. Electronically Signed   By: Susan Ensign   On: 11/20/2023 09:54   US  THORACENTESIS ASP PLEURAL SPACE W/IMG GUIDE Result Date: 11/19/2023 INDICATION: Patient with history of right-sided back pain, dyspnea, pneumonia, complex/ loculated right pleural effusion; request received for diagnostic and therapeutic right thoracentesis EXAM: ULTRASOUND GUIDED DIAGNOSTIC AND THERAPEUTIC RIGHT THORACENTESIS MEDICATIONS: 8 mL 1% lidocaine COMPLICATIONS: None immediate. PROCEDURE: An ultrasound guided thoracentesis was thoroughly discussed with the patient and questions answered. The benefits, risks, alternatives and complications were also discussed. The patient understands and wishes to proceed with the procedure. Written consent was obtained. Ultrasound was performed to localize and mark an adequate pocket of fluid in the right chest. The area was then prepped and draped in the normal sterile fashion. 1% Lidocaine was used for local anesthesia. Under ultrasound guidance a 6 Fr Safe-T-Centesis catheter was introduced. Thoracentesis was performed. The catheter was removed and a dressing applied. FINDINGS: A total of approximately 130 cc of hazy, yellow fluid was removed. Samples were sent to the laboratory as requested by the clinical team. Despite catheter manipulation only the above amount of fluid could be aspirated due to the complex multiloculated nature of the pleural collection. IMPRESSION: Successful ultrasound guided diagnostic and therapeutic right thoracentesis yielding 130 cc of pleural fluid. Performed by: Honore Lux, PA-C Electronically Signed   By: Susan Ensign   On: 11/19/2023 14:17   DG Chest Port 1 View Result Date: 11/19/2023 CLINICAL DATA:  Status post thoracentesis. EXAM: PORTABLE CHEST 1 VIEW COMPARISON:  X-ray 11/18/2023 FINDINGS: Underinflation. Persistent  large right effusion with adjacent opacity. No pneumothorax. Persistent linear opacity  left lung base likely scar or atelectasis. Enlarged cardiopericardial silhouette. Vascular congestion. Overlapping cardiac leads. IMPRESSION: No pneumothorax seen post thoracentesis. Electronically Signed   By: Adrianna Horde M.D.   On: 11/19/2023 11:32   CT Chest Wo Contrast Result Date: 11/18/2023 CLINICAL DATA:  Abnormal chest x-ray with back pain short of breath EXAM: CT CHEST WITHOUT CONTRAST TECHNIQUE: Multidetector CT imaging of the chest was performed following the standard protocol without IV contrast. RADIATION DOSE REDUCTION: This exam was performed according to the departmental dose-optimization program which includes automated exposure control, adjustment of the mA and/or kV according to patient size and/or use of iterative reconstruction technique. COMPARISON:  Chest x-ray 11/18/2023 FINDINGS: Cardiovascular: Limited evaluation without intravenous contrast. Moderate aortic atherosclerosis. Nonaneurysmal aorta. Borderline cardiomegaly. Trace pericardial effusion. Coronary vascular calcifications. Mediastinum/Nodes: Patent trachea. No suspicious thyroid  mass. Calcified mediastinal and hilar nodes consistent with prior granulomatous disease. Mildly enlarged paratracheal nodes measuring up to 14 mm. Enlarged subcarinal nodes measuring up to 18 mm. Esophagus within normal limits. Lungs/Pleura: Moderate loculated right pleural effusion. Consolidation within the right middle and lower lobes with air bronchograms. Punctate calcific densities within the consolidated right lower lobe. Similar punctate calcifications in the left lung base. Bilateral bronchial wall thickening. Upper Abdomen: Gallstones.  No acute finding Musculoskeletal: No acute osseous abnormality. Multilevel degenerative change IMPRESSION: 1. Moderate loculated right pleural effusion. Consolidation within the right middle and lower lobes with air bronchograms, findings are suspicious for pneumonia. Imaging follow-up to resolution is  recommended to exclude underlying mass. Punctate calcific densities in the lower lobes, could reflect small granulomas or sequela of previous aspiration. 2. Mildly enlarged mediastinal lymph nodes, likely reactive. 3. Gallstones. 4. Aortic atherosclerosis. Aortic Atherosclerosis (ICD10-I70.0). Electronically Signed   By: Esmeralda Hedge M.D.   On: 11/18/2023 17:58   DG Chest 2 View Result Date: 11/18/2023 CLINICAL DATA:  Back pain and shortness of breath. EXAM: CHEST - 2 VIEW COMPARISON:  None Available. FINDINGS: The cardiac silhouette is mildly enlarged. Low lung volumes are noted with mild to moderate severity right perihilar and marked severity right basilar atelectasis and/or infiltrate. Mild to moderate severity left basilar scarring, atelectasis and/or infiltrate is also seen. There is a large right pleural effusion with a large loculated component suspected along the periphery of the mid right lung. No pneumothorax is identified. The visualized skeletal structures are unremarkable. IMPRESSION: 1. Large right pleural effusion with a large loculated component suspected along the periphery of the mid right lung. Correlation with chest CT is recommended. 2. Right perihilar and bibasilar atelectasis and/or infiltrate. Further evaluation with chest CT is recommended, as sequelae associated with an underlying neoplastic process cannot be excluded. Electronically Signed   By: Virgle Grime M.D.   On: 11/18/2023 16:40    Labs:  CBC: Recent Labs    11/18/23 1515 11/18/23 2358 11/19/23 0504 11/20/23 0457  WBC 26.8* 21.3* 22.2* 17.9*  HGB 13.2 12.1* 12.5* 11.7*  HCT 38.2* 36.9* 38.7* 35.2*  PLT 286 237 233 269    COAGS: Recent Labs    11/19/23 0504  INR 1.6*    BMP: Recent Labs    11/18/23 1515 11/18/23 2358 11/19/23 0504 11/20/23 0457  NA 135  --  137 136  K 3.7  --  3.4* 3.4*  CL 95*  --  103 100  CO2 22  --  21* 22  GLUCOSE 205*  --  159* 163*  BUN 35*  --  30* 29*  CALCIUM  9.7  --  8.3* 8.1*  CREATININE 1.91* 1.67* 1.48* 1.22  GFRNONAA 36* 42* 49* >60    LIVER FUNCTION TESTS: Recent Labs    11/19/23 0504  BILITOT 1.1  AST 15  ALT 14  ALKPHOS 61  PROT 6.3*  ALBUMIN 2.7*    Assessment and Plan: 78 y.o. male with past medical history significant for hypertension, chronic kidney disease, diabetes, hyperlipidemia who recently presented to [facility] with right-sided back pain, dyspnea and finding of complex multiloculated right pleural effusion/?pneumonia on imaging.  Patient s/p right thoracentesis 6/19 yielding only 130 cc of hazy, yellow fluid ; s/p right chest drain placement on 6/19; temp 99.6, WBC 17.9 down from 22.2, hemoglobin stable at 11.7, creatinine 1.22, pleural fluid cultures/cytology pending; CCM has initiated fibrinolysis via the pleural catheter with some increasing output.  Chest x-ray today with decrease in right pleural fluid but persistent relatively diffuse right-sided airspace disease, cardiomegaly with mild pulmonary venous congestion; management as per CCM/TRH    Electronically Signed: D. Honore Lux, PA-C 11/20/2023, 1:38 PM   I spent a total of 15 Minutes at the the patient's bedside AND on the patient's hospital floor or unit, greater than 50% of which was counseling/coordinating care for right chest drain    Patient ID: Lamar Pillar, male   DOB: 24-Jan-1946, 78 y.o.   MRN: [id number]

## 2023-11-20 NOTE — Procedures (Signed)
 Pleural Fibrinolytic Administration Procedure Note  Jagjit Riner  [id number]  [birth year]  Date:11/20/23  Time:1:13 PM   Provider Performing:Parks Czajkowski Melven Stable Alayne Hubert   Procedure: Pleural Fibrinolysis Subsequent day [phone])  Indication(s): Fibrinolysis of complicated pleural effusion  Consent: Risks of the procedure as well as the alternatives and risks of each were explained to the patient and/or caregiver.  Consent for the procedure was obtained.  Anesthesia: None  Time Out: Verified patient identification, verified procedure, site/side was marked, verified correct patient position, special equipment/implants available, medications/allergies/relevant history reviewed, required imaging and test results available.  Sterile Technique: Hand hygiene, gloves  Procedure Description: Existing pleural catheter was cleaned and accessed in sterile manner.  1-way stopcock exchanged for 3-way stopcock. 10mg  of tPA in 30cc of saline and 5mg  of dornase in 30cc of sterile water were injected into pleural space using existing pleural catheter.  Catheter will be clamped for 1 hour and then placed back to suction.  Complications/Tolerance: None; patient tolerated the procedure well.  EBL: None  Specimen(s): None  [place], [place] [facility] 11/20/23 1:14 PM  Please see [email or web address] for pager details.  From 7A-7P if no response, please call [phone] After hours, please call ELink [phone]

## 2023-11-20 NOTE — Progress Notes (Signed)
 Mobility Specialist - Progress Note  ([employer] 4L) Pre-mobility: 91 bpm HR, 92% SpO2 During mobility: 95 bpm HR, 90% SpO2 Post-mobility: 86 bpm HR, 91% PO2   11/20/23 1242  Mobility  Activity Ambulated with assistance in room  Level of Assistance Minimal assist, patient does 75% or more  Assistive Device Front wheel walker  Distance Ambulated (ft) 4 ft  Range of Motion/Exercises Active Assistive  Activity Response Tolerated fair  Mobility visit 1 Mobility  Mobility Specialist Start Time (ACUTE ONLY) 1230  Mobility Specialist Stop Time (ACUTE ONLY) 1242  Mobility Specialist Time Calculation (min) (ACUTE ONLY) 12 min   Pt was found on BSC and agreeable to mobilize. Stated only wanting to take a couple steps in room due to feeling weak. At EOS returned to bed with all needs met. Call bell in reach.  Lorna Rose,  Mobility Specialist Can be reached via Secure Chat

## 2023-11-21 ENCOUNTER — Inpatient Hospital Stay (HOSPITAL_COMMUNITY)

## 2023-11-21 DIAGNOSIS — J90 Pleural effusion, not elsewhere classified: Secondary | ICD-10-CM | POA: Diagnosis not present

## 2023-11-21 DIAGNOSIS — J9601 Acute respiratory failure with hypoxia: Secondary | ICD-10-CM | POA: Diagnosis not present

## 2023-11-21 DIAGNOSIS — J869 Pyothorax without fistula: Secondary | ICD-10-CM | POA: Diagnosis not present

## 2023-11-21 DIAGNOSIS — J189 Pneumonia, unspecified organism: Secondary | ICD-10-CM | POA: Diagnosis not present

## 2023-11-21 DIAGNOSIS — I10 Essential (primary) hypertension: Secondary | ICD-10-CM | POA: Diagnosis not present

## 2023-11-21 LAB — BASIC METABOLIC PANEL WITH GFR
Anion gap: 13 (ref 5–15)
BUN: 40 mg/dL — ABNORMAL HIGH (ref 8–23)
CO2: 23 mmol/L (ref 22–32)
Calcium: 8.1 mg/dL — ABNORMAL LOW (ref 8.9–10.3)
Chloride: 98 mmol/L (ref 98–111)
Creatinine, Ser: 1.52 mg/dL — ABNORMAL HIGH (ref 0.61–1.24)
GFR, Estimated: 47 mL/min — ABNORMAL LOW (ref >60)
Glucose, Bld: 149 mg/dL — ABNORMAL HIGH (ref 70–99)
Potassium: 3.6 mmol/L (ref 3.5–5.1)
Sodium: 134 mmol/L — ABNORMAL LOW (ref 135–145)

## 2023-11-21 LAB — GLUCOSE, CAPILLARY
Glucose-Capillary: 163 mg/dL — ABNORMAL HIGH (ref 70–99)
Glucose-Capillary: 183 mg/dL — ABNORMAL HIGH (ref 70–99)
Glucose-Capillary: 160 mg/dL — ABNORMAL HIGH (ref 70–99)
Glucose-Capillary: 178 mg/dL — ABNORMAL HIGH (ref 70–99)

## 2023-11-21 LAB — CBC
HCT: 37.1 % — ABNORMAL LOW (ref 39.0–52.0)
Hemoglobin: 12 g/dL — ABNORMAL LOW (ref 13.0–17.0)
MCH: 30.4 pg (ref 26.0–34.0)
MCHC: 32.3 g/dL (ref 30.0–36.0)
MCV: 93.9 fL (ref 80.0–100.0)
Platelets: 305 10*3/uL (ref 150–400)
RBC: 3.95 MIL/uL — ABNORMAL LOW (ref 4.22–5.81)
RDW: 13.5 % (ref 11.5–15.5)
WBC: 16.9 10*3/uL — ABNORMAL HIGH (ref 4.0–10.5)
nRBC: 0 % (ref 0.0–0.2)

## 2023-11-21 LAB — STREP PNEUMONIAE URINARY ANTIGEN: Strep Pneumo Urinary Antigen: NEGATIVE

## 2023-11-21 MED ORDER — SODIUM CHLORIDE 0.9 % IV SOLN: INTRAVENOUS | Status: AC

## 2023-11-21 MED ORDER — STERILE WATER FOR INJECTION IJ SOLN
5.0000 mg | Freq: Once | RESPIRATORY_TRACT | Status: AC
  Administered 2023-11-21: 5 mg via INTRAPLEURAL
  Filled 2023-11-21: qty 5

## 2023-11-21 MED ORDER — SODIUM CHLORIDE (PF) 0.9 % IJ SOLN
10.0000 mg | Freq: Once | INTRAMUSCULAR | Status: AC
  Administered 2023-11-21: 10 mg via INTRAPLEURAL
  Filled 2023-11-21: qty 10

## 2023-11-21 NOTE — Progress Notes (Signed)
 PROGRESS NOTE    Jonathan Jimenez  FMW:[id number] DOB: May 17, 1946 DOA: 11/18/2023 PCP: Larnell Hamilton, MD   Brief Narrative: Jonathan Jimenez is a 78 y.o. male with a history of hypertension, CKD stage IIIa, diabetes mellitus type 2, hyperlipidemia.  Patient presented secondary to right sided back pain with associated shortness of breath and was found to have pneumonia with a right-sided loculated pleural effusion. Empiric antibiotics started. Pulmonology consulted for loculated effusion and consulted IR for right chest tube placement, which was successfully performed on 6/19.   Assessment and Plan:  Community acquired pneumonia Present on admission. Blood cultures obtained on admission. Patient started empirically on ceftriaxone  and azithromycin . Blood cultures with no growth to date. Leukocytosis improving. -Continue Ceftriaxone  and azithromycin  -Follow-up blood cultures  Severe sepsis Present on admission. Secondary to pneumonia.  Right-sided loculated pleural effusion Likely secondary to pneumonia. IR consulted for thoracentesis but only yielded 130 mL of fluid; cultures and labs sent. Pulmonology consulted and consulted IR for right chest tube placement, which was performed on 6/19. Lytics x2 per pulmonology. -Follow-up pleural fluid labs -Pulmonology recommendations: chest tube management  Acute respiratory failure with hypoxia SpO2 down to 87% on room air. Patient placed on nasal canula. Rate increased to 5 L/min for ongoing hypoxia. -Continue oxygen therapy; if continues to worsen, will need to transfer to progressive care -Wean to room air as able -Incentive spirometer -PT/OT  Primary hypertension Noted. Patient is on amlodipine, triamterene-hydrochlorothiazide and Coreg  as an outpatient. All medication held on admission. -Continue Coreg   AKI on CKD stage IIIa Unknown baseline creatinine, however, Creatinine of 1.91 on admission likely above baseline as he has a history of only  stage IIIa disease. Triamterene-hydrochlorothiazide held on admission. Creatinine improved to 1.22 but now acutely worsened back up to 1.52. patient admits to poor fluid intake.  Diabetes mellitus, type 2 Well controlled based on hemoglobin A1C of 6.3%. Patient is managed on metformin as an outpatient.  Hyperlipidemia -Continue simvastatin   Hypokalemia Potassium supplementation given.   DVT prophylaxis: Subcutaneous heparin  Code Status:   Code Status: Full Code Family Communication: None at bedside. Son via telephone at bedside. Disposition Plan: Discharge home likely not for several days pending ongoing management of pleural effusion   Consultants:  Pulmonology  Procedures:  Thoracentesis Right chest tube placement  Antimicrobials: Ceftriaxone  Azithromycin     Subjective: Feeling better today. No specific concerns.  Objective: BP 114/69   Pulse 84   Temp 98.5 F (36.9 C) (Oral)   Resp (!) 21   Ht 5' 9 (1.753 m)   Wt 102.1 kg   SpO2 91%   BMI 33.24 kg/m   Examination:  General exam: Appears calm and comfortable Respiratory system: Diminished on right but no wheezing or rales. Respiratory effort normal. Cardiovascular system: S1 & S2 heard, RRR. No murmurs, rubs, gallops or clicks. Gastrointestinal system: Abdomen is nondistended, soft and nontender. Normal bowel sounds heard. Central nervous system: Alert and oriented. No focal neurological deficits. Psychiatry: Judgement and insight appear normal. Mood & affect appropriate.    Data Reviewed: I have personally reviewed following labs and imaging studies  CBC Lab Results  Component Value Date   WBC 16.9 (H) 11/21/2023   RBC 3.95 (L) 11/21/2023   HGB 12.0 (L) 11/21/2023   HCT 37.1 (L) 11/21/2023   MCV 93.9 11/21/2023   MCH 30.4 11/21/2023   PLT 305 11/21/2023   MCHC 32.3 11/21/2023   RDW 13.5 11/21/2023     Last metabolic panel Lab Results  Component  Value Date   NA 134 (L) 11/21/2023   K 3.6  11/21/2023   CL 98 11/21/2023   CO2 23 11/21/2023   BUN 40 (H) 11/21/2023   CREATININE 1.52 (H) 11/21/2023   GLUCOSE 149 (H) 11/21/2023   GFRNONAA 47 (L) 11/21/2023   CALCIUM 8.1 (L) 11/21/2023   PROT 6.3 (L) 11/19/2023   ALBUMIN 2.7 (L) 11/19/2023   BILITOT 1.1 11/19/2023   ALKPHOS 61 11/19/2023   AST 15 11/19/2023   ALT 14 11/19/2023   ANIONGAP 13 11/21/2023    GFR: Estimated Creatinine Clearance: 49.5 mL/min (A) (by C-G formula based on SCr of 1.52 mg/dL (H)).  Recent Results (from the past 240 hours)  Blood culture (routine x 2)     Status: None (Preliminary result)   Collection Time: 11/18/23  3:59 PM   Specimen: BLOOD  Result Value Ref Range Status   Specimen Description   Final    BLOOD LEFT ANTECUBITAL Performed at [facility], [address] [place], [place] [address]    Special Requests   Final    Blood Culture adequate volume BOTTLES DRAWN AEROBIC AND ANAEROBIC Performed at [facility], [address], [place], [place] [address]    Culture   Final    NO GROWTH 3 DAYS Performed at [facility], [address], [place], [place] [address]    Report Status PENDING  Incomplete  Blood culture (routine x 2)     Status: None (Preliminary result)   Collection Time: 11/18/23  4:04 PM   Specimen: BLOOD  Result Value Ref Range Status   Specimen Description   Final    BLOOD RIGHT ANTECUBITAL Performed at [facility], [address] [place], [place] [address]    Special Requests   Final    Blood Culture results may not be optimal due to an inadequate volume of blood received in culture bottles BOTTLES DRAWN AEROBIC AND ANAEROBIC Performed at [facility], [address] [place], [place] [address]    Culture   Final    NO GROWTH 3 DAYS Performed at [facility], [address] [place], [place] [address]    Report Status PENDING  Incomplete  Body fluid culture w Gram  Stain     Status: None (Preliminary result)   Collection Time: 11/19/23 11:08 AM   Specimen: Pleura; Body Fluid  Result Value Ref Range Status   Specimen Description   Final    PLEURAL Performed at [facility], [address] [place], [place] [address]    Special Requests   Final    RIGHT Performed at [facility], [address] [place], [place] [address]    Gram Stain   Final    RARE WBC PRESENT, PREDOMINANTLY PMN NO ORGANISMS SEEN    Culture   Final    NO GROWTH 2 DAYS Performed at [facility], [address] [place], [place] [address]    Report Status PENDING  Incomplete      Radiology Studies: DG CHEST PORT 1 VIEW Result Date: 11/21/2023 CLINICAL DATA:  Empyema.  Chest tube placement EXAM: PORTABLE CHEST 1 VIEW COMPARISON:  11/20/2018 FINDINGS: Marked reduction in pleural fluid in the RIGHT hemithorax following chest tube placement. Chest tube coiled in the RIGHT lung base. Persistent RIGHT basilar atelectasis. No pneumothorax. LEFT lung remains relatively clear. IMPRESSION: Marked reduction in RIGHT pleural fluid following chest tube placement. Electronically Signed   By: Jackquline  Malva M.D.   On: 11/21/2023 10:40   DG Chest Port 1 View Result Date: 11/20/2023 CLINICAL DATA:  Right-sided pleural effusion and chest tube EXAM: PORTABLE CHEST 1 VIEW COMPARISON:  Yesterday FINDINGS: Right pleural pigtail catheter is been placed in the interval. Patient rotated to the left. The Chin overlies the upper chest. Moderate cardiomegaly. Small to moderate right pleural effusion, decreased. No pneumothorax. Mild interstitial edema. Relatively similar right sided airspace disease, relatively diffuse with sparing of the right apex. IMPRESSION: Placement of a right-sided pleural pigtail catheter with decrease in right-sided pleural fluid but persistent relatively diffuse right-sided airspace disease. Cardiomegaly with mild pulmonary venous  congestion. Electronically Signed   By: Rockey Kilts M.D.   On: 11/20/2023 10:09   CT [facility] PLEURAL DRAIN W/INDWELL CATH W/IMG GUIDE Result Date: 11/20/2023 INDICATION: Right-sided empyema planned placement of a right-sided pleural drain for therapy. EXAM: CT-guided chest tube placement TECHNIQUE: Multidetector CT imaging of the chest was performed following the standard protocol without IV contrast. RADIATION DOSE REDUCTION: This exam was performed according to the departmental dose-optimization program which includes automated exposure control, adjustment of the mA and/or kV according to patient size and/or use of iterative reconstruction technique. MEDICATIONS: The patient is currently admitted to the hospital and receiving intravenous antibiotics. The antibiotics were administered within an appropriate time frame prior to the initiation of the procedure. ANESTHESIA/SEDATION: Moderate (conscious) sedation was employed during this procedure. A total of Versed  2 mg and Fentanyl  100 mcg was administered intravenously by the radiology nurse. Total intra-service moderate Sedation Time: 16 minutes. The patient's level of consciousness and vital signs were monitored continuously by radiology nursing throughout the procedure under my direct supervision. COMPLICATIONS: None immediate. PROCEDURE: Informed written consent was obtained from the patient after a thorough discussion of the procedural risks, benefits and alternatives. All questions were addressed. Maximal Sterile Barrier Technique was utilized including caps, mask, sterile gowns, sterile gloves, sterile drape, hand hygiene and skin antiseptic. A timeout was performed prior to the initiation of the procedure. The patient was placed in left lateral decubitus position on the CT gantry. Initial axial images of the chest were obtained to delineate the areas of the empyema and targeting. Radiopaque markers were then placed on the patient's chest and further imaging  was performed. The patient was then marked, prepped, and draped in the usual sterile fashion. Local anesthesia was achieved with 1% lidocaine . A stab incision was then made in the right axillary region and a 10 cm Yueh needle was advanced from the incision to the pleural space with intermittent axial imaging of the chest verified needle position. Once in the pleural cavity, the needle was removed leaving the cannula in position. A short Amplatz wire was then advanced into the pleural space. Access was then exchanged over the guidewire for a new 10 French pigtail catheter. The catheter was advanced into the pleural space. The stiffener was removed, the locking color engaged, and fluid was withdrawn. The catheter was then connected to suction. Retention suture and sterile dressing applied. IMPRESSION: Satisfactory place mint of a 10 French pigtail drainage catheter in the right pleural space. Electronically Signed   By: Cordella Banner   On: 11/20/2023 09:54      LOS: 3 days    Elgin Lam, MD [facility] 11/21/2023, 2:03 PM   If 7PM-7AM, please contact night-coverage [email or web address]

## 2023-11-21 NOTE — Evaluation (Signed)
 Occupational Therapy Evaluation Patient Details Name: Jonathan Jimenez MRN: [id number] DOB: 12-23-1945 Today's Date: 11/21/2023   History of Present Illness   Patient is a 78 year old male who presented on 6/18 with SOB and R side back pain. Patient was admitted with pneumonia, severe sepsis, reside side loculated pleural effusion, acute respiratory failure with hypoxia, and HTN. PMH:CKD III, DM II, Hyperlipidmia.     Clinical Impressions Patient is a 78 year old male who was admitted for above. Patient was living at home with no AD caring for wife who is w/c bound per patient report. Patient needed 1L/min to maintain O2 during session. Patient was noted to have decreased functional activity tolerance, decreased endurance, decreased standing balance, increased pain in chest near chest tube site, decreased safety awareness, and decreased knowledge of AD/AE impacting participation in ADLs. Plan is for patient to d/c home with 24/7 caregiver support and [facility] services when medically stable.      If plan is discharge home, recommend the following:   A lot of help with walking and/or transfers;A lot of help with bathing/dressing/bathroom;Direct supervision/assist for medications management;Assistance with cooking/housework;Help with stairs or ramp for entrance;Direct supervision/assist for financial management;Assist for transportation     Functional Status Assessment   Patient has had a recent decline in their functional status and demonstrates the ability to make significant improvements in function in a reasonable and predictable amount of time.     Equipment Recommendations   Other (comment) (RW)      Precautions/Restrictions   Precautions Precautions: Fall Restrictions Weight Bearing Restrictions Per Provider Order: No     Mobility Bed Mobility Overal bed mobility: Needs Assistance Bed Mobility: Supine to Sit     Supine to sit: Mod assist     General bed mobility comments:  with cues for rolling          Balance Overall balance assessment: Mild deficits observed, not formally tested                   ADL either performed or assessed with clinical judgement   ADL Overall ADL's : Needs assistance/impaired Eating/Feeding: Supervision/ safety;Sitting   Grooming: Set up;Sitting   Upper Body Bathing: Sitting;Set up;Supervision/ safety   Lower Body Bathing: Sitting/lateral leans;Maximal assistance   Upper Body Dressing : Sitting;Minimal assistance   Lower Body Dressing: Sitting/lateral leans;Maximal assistance Lower Body Dressing Details (indicate cue type and reason): uanble to bring feet to lap Toilet Transfer: Minimal assistance;Ambulation;Rolling walker (2 wheels) Toilet Transfer Details (indicate cue type and reason): into hallway with cues to keep feet further apart. RLE tends to adduct into other foot when stepping especially when turning Toileting- Clothing Manipulation and Hygiene: Sitting/lateral lean;Maximal assistance               Vision   Vision Assessment?: No apparent visual deficits            Pertinent Vitals/Pain Pain Assessment Pain Assessment: No/denies pain     Extremity/Trunk Assessment Upper Extremity Assessment Upper Extremity Assessment: Overall WFL for tasks assessed   Lower Extremity Assessment Lower Extremity Assessment: Defer to PT evaluation       Communication     Cognition Arousal: Alert Behavior During Therapy: [facility] for tasks assessed/performed Cognition: No apparent impairments         Following commands: Intact                  Home Living Family/patient expects to be discharged to:: Private residence Living Arrangements:  Spouse/significant other   Type of Home: House Home Access: Stairs to enter     Home Layout: One level     Bathroom Shower/Tub: Walk-in shower             Additional Comments: patient has wife at home who is w/c bound who he helps.       Prior Functioning/Environment Prior Level of Function : Independent/Modified Independent                    OT Problem List: Impaired balance (sitting and/or standing);Decreased knowledge of precautions;Decreased safety awareness;Decreased knowledge of use of DME or AE;Decreased activity tolerance   OT Treatment/Interventions: Self-care/ADL training;DME and/or AE instruction;Balance training;Therapeutic activities;Energy conservation;Patient/family education      OT Goals(Current goals can be found in the care plan section)   Acute Rehab OT Goals Patient Stated Goal: to get back home to care for wife. son is staying for short time OT Goal Formulation: With patient Time For Goal Achievement: 12/05/23 Potential to Achieve Goals: Fair   OT Frequency:  Min 2X/week    Co-evaluation PT/OT/SLP Co-Evaluation/Treatment: Yes Reason for Co-Treatment: For patient/therapist safety PT goals addressed during session: Mobility/safety with mobility OT goals addressed during session: ADL's and self-care      AM-PAC OT 6 Clicks Daily Activity     Outcome Measure Help from another person eating meals?: A Little Help from another person taking care of personal grooming?: A Little Help from another person toileting, which includes using toliet, bedpan, or urinal?: A Lot Help from another person bathing (including washing, rinsing, drying)?: A Lot Help from another person to put on and taking off regular upper body clothing?: A Little Help from another person to put on and taking off regular lower body clothing?: A Lot 6 Click Score: 15   End of Session Equipment Utilized During Treatment: Rolling walker (2 wheels);Oxygen Nurse Communication: Mobility status;Other (comment) (O2 during session)  Activity Tolerance: Patient tolerated treatment well Patient left: in bed;with call bell/phone within reach;with bed alarm set  OT Visit Diagnosis: Unsteadiness on feet (R26.81);Other  abnormalities of gait and mobility (R26.89);Muscle weakness (generalized) (M62.81)                Time: 8583-8560 OT Time Calculation (min): 23 min Charges:  OT General Charges $OT Visit: 1 Visit OT Evaluation $OT Eval Moderate Complexity: 1 Mod  Josecarlos Harriott OTR/L, MS Acute Rehabilitation Department Office# [phone]   Geofm CHRISTELLA Dance 11/21/2023, 4:38 PM

## 2023-11-21 NOTE — Progress Notes (Signed)
 OT Cancellation Note  Patient Details Name: Maverick Dieudonne MRN: [id number] DOB: 07-05-1945   Cancelled Treatment:    Reason Eval/Treat Not Completed: Patient at procedure or test/ unavailable Patients nurse is in room changing chest tube. OT to continue to follow and check back as schedule will allow.  Geofm LEYLAND, MS Acute Rehabilitation Department Office# [phone]  11/21/2023, 11:10 AM

## 2023-11-21 NOTE — Progress Notes (Signed)
   NAME:  Jonathan Jimenez, MRN:  [id number], DOB:  Mar 23, 1946, LOS: 3 ADMISSION DATE:  11/18/2023, CONSULTATION DATE:  11/19/23  REFERRING MD:  Dr Briana, CHIEF COMPLAINT:  loculated pleural effusion   History of Present Illness:  Asked to see patient for a loculated pleural effusion  He had thoracentesis 6/19 430 cc of hazy yellow fluid  Came in with a couple of days history of chest discomfort.  Had a chest x-ray and a CT scan of the chest showing a moderately large loculated pleural effusion with underlying pneumonia    Usually healthy Never smoker Worked as a Music therapist Denies underlying lung disease  Pertinent  Medical History   diabetes, chronic kidney disease stage IIIa, hyperlipidemia   Significant Hospital Events: Including procedures, antibiotic start and stop dates in addition to other pertinent events   11/18/2023-CT scan of the chest showing loculated moderate effusion 6/19 lytics x 1 6/20 #2  Interim History / Subjective:   Impressive drainage since yesterday 1.4 L, pleural VAC has been changed No fevers, chest pain or dyspnea   Objective    Blood pressure 114/69, pulse 84, temperature 98.5 F (36.9 C), temperature source Oral, resp. rate (!) 21, height 5' 9 (1.753 m), weight 102.1 kg, SpO2 91%.        Intake/Output Summary (Last 24 hours) at 11/21/2023 1241 Last data filed at 11/21/2023 1102 Gross per 24 hour  Intake 170 ml  Output 1680 ml  Net -1510 ml   Filed Weights   11/18/23 1450 11/18/23 2145  Weight: 102.1 kg 102.1 kg    Examination: Unchanged General: Elderly,  appears comfortable HENT: Moist oral mucosa Lungs: Decreased breath sounds right base, no accessory muscle use, serosanguineous fluid draining from pigtail with some chunks Cardiovascular: S1-S2 regular, no murmur Abdomen: Soft, bowel sounds appreciated Extremities: No clubbing, no edema Neuro: Alert and oriented x 3   Labs show decreasing leukocytosis, stable hemoglobin, mild  hypokalemia Chest x-ray shows unremarkable collection, no pleural fluid  Urine strep antigen negative  Resolved problem list   Assessment and Plan   Multilobar pneumonia of right lung Community-acquired pneumonia - Continue azithromycin  and ceftriaxone  - Cultures negative  Right empyema -status post lytics x 2 with good drainage and improvement on chest x-ray Will give lytics again today, very hopeful that we will get full resolution and will not need surgery here Fluid culture negative so far Flush pigtail per protocol  Harden Staff MD. FCCP.  Pulmonary & Critical care Pager : [phone]  If no response to pager , please call [phone] until 7 pm After 7:00 pm call Elink  [phone]   11/21/2023

## 2023-11-21 NOTE — Evaluation (Signed)
 Physical Therapy Evaluation Patient Details Name: Jonathan Jimenez MRN: [id number] DOB: 22-Jun-1945 Today's Date: 11/21/2023  History of Present Illness  Patient is a 78 year old male who presented on 6/18 with SOB and R side back pain. Patient was admitted with pneumonia, severe sepsis, reside side loculated pleural effusion, acute respiratory failure with hypoxia, and HTN. PMH:CKD III, DM II, Hyperlipidmia.  Clinical Impression   Patient is a 78 year old male who was admitted for above. Patient was living at home with no AD caring for wife who is w/c bound per patient report. Patient needed 1L/min to maintain O2 during session. Patient was noted to have decreased functional activity tolerance, decreased endurance, decreased standing balance, increased pain in chest near chest tube site, and questionable safety awareness.  Plan is for patient to d/c home with 24/7 caregiver support and [facility] services when medically stable.       If plan is discharge home, recommend the following: A little help with walking and/or transfers;A little help with bathing/dressing/bathroom;Assistance with cooking/housework;Assist for transportation;Help with stairs or ramp for entrance   Can travel by private vehicle        Equipment Recommendations Rolling walker (2 wheels) (dependent on acute stay progress)  Recommendations for Other Services       Functional Status Assessment Patient has had a recent decline in their functional status and demonstrates the ability to make significant improvements in function in a reasonable and predictable amount of time.     Precautions / Restrictions Precautions Precautions: Fall;Other (comment) Precaution/Restrictions Comments: Chest tube in place on R Restrictions Weight Bearing Restrictions Per Provider Order: No      Mobility  Bed Mobility Overal bed mobility: Needs Assistance Bed Mobility: Supine to Sit, Sit to Supine     Supine to sit: Mod assist Sit to supine: Mod  assist   General bed mobility comments: use of bed rail; assist to manage trunk to sitting and bil LEs back to bed    Transfers Overall transfer level: Needs assistance Equipment used: Rolling walker (2 wheels) Transfers: Sit to/from Stand Sit to Stand: Min assist, From elevated surface           General transfer comment: cues for use of UEs to self assist    Ambulation/Gait Ambulation/Gait assistance: Min assist, Contact guard assist Gait Distance (Feet): 50 Feet Assistive device: Rolling walker (2 wheels) Gait Pattern/deviations: Step-to pattern, Step-through pattern, Decreased step length - right, Decreased step length - left, Shuffle, Trunk flexed Gait velocity: decr     General Gait Details: Increased time with cues for posture and position from [employer]            Wheelchair Mobility     Tilt Bed    Modified Rankin (Stroke Patients Only)       Balance Overall balance assessment: Mild deficits observed, not formally tested                                           Pertinent Vitals/Pain Pain Assessment Pain Assessment: No/denies pain    Home Living Family/patient expects to be discharged to:: Private residence Living Arrangements: Spouse/significant other   Type of Home: House Home Access: Level entry       Home Layout: One level   Additional Comments: patient has wife at home who is w/c bound who he helps.    Prior Function  Prior Level of Function : Independent/Modified Independent                     Extremity/Trunk Assessment   Upper Extremity Assessment Upper Extremity Assessment: Overall WFL for tasks assessed    Lower Extremity Assessment Lower Extremity Assessment: Overall WFL for tasks assessed       Communication        Cognition Arousal: Alert Behavior During Therapy: WFL for tasks assessed/performed                             Following commands: Intact       Cueing  Cueing Techniques: Verbal cues     General Comments      Exercises     Assessment/Plan    PT Assessment Patient needs continued PT services  PT Problem List Decreased activity tolerance;Decreased balance;Decreased mobility;Decreased knowledge of use of DME;Obesity       PT Treatment Interventions DME instruction;Gait training;Functional mobility training;Therapeutic activities;Therapeutic exercise;Balance training;Patient/family education    PT Goals (Current goals can be found in the Care Plan section)  Acute Rehab PT Goals Patient Stated Goal: REgain IND to care for disabled spouse PT Goal Formulation: With patient Time For Goal Achievement: 12/04/23 Potential to Achieve Goals: Good    Frequency Min 3X/week     Co-evaluation PT/OT/SLP Co-Evaluation/Treatment: Yes Reason for Co-Treatment: For patient/therapist safety PT goals addressed during session: Mobility/safety with mobility OT goals addressed during session: ADL's and self-care       AM-PAC PT 6 Clicks Mobility  Outcome Measure Help needed turning from your back to your side while in a flat bed without using bedrails?: A Lot Help needed moving from lying on your back to sitting on the side of a flat bed without using bedrails?: A Lot Help needed moving to and from a bed to a chair (including a wheelchair)?: A Lot Help needed standing up from a chair using your arms (e.g., wheelchair or bedside chair)?: A Little Help needed to walk in hospital room?: A Little Help needed climbing 3-5 steps with a railing? : A Lot 6 Click Score: 14    End of Session Equipment Utilized During Treatment: Oxygen Activity Tolerance: Patient limited by fatigue Patient left: in bed;with call bell/phone within reach;with bed alarm set Nurse Communication: Mobility status PT Visit Diagnosis: Unsteadiness on feet (R26.81);Difficulty in walking, not elsewhere classified (R26.2)    Time: 8583-8557 PT Time Calculation (min) (ACUTE  ONLY): 26 min   Charges:   PT Evaluation $PT Eval Moderate Complexity: 1 Mod   PT General Charges $$ ACUTE PT VISIT: 1 Visit         Katrinka Acton PT Acute Rehabilitation Services Pager [phone] Office [phone]   Taylynn Easton 11/21/2023, 5:18 PM

## 2023-11-21 NOTE — Procedures (Signed)
 Pleural Fibrinolytic Administration Procedure Note  Jonathan Jimenez  [id number]  11-25-1945  Date:11/21/23  Time:4:16 PM   Provider Performing:Jenah Vanasten V. Eathon Valade   Procedure: Pleural Fibrinolysis Subsequent day (67437)  Indication(s) Fibrinolysis of complicated pleural effusion  Consent Risks of the procedure as well as the alternatives and risks of each were explained to the patient and/or caregiver.  Consent for the procedure was obtained.   Anesthesia None   Time Out Verified patient identification, verified procedure, site/side was marked, verified correct patient position, special equipment/implants available, medications/allergies/relevant history reviewed, required imaging and test results available.   Sterile Technique Hand hygiene, gloves   Procedure Description Existing pleural catheter was cleaned and accessed in sterile manner.  10mg  of tPA in 30cc of saline and 5mg  of dornase in 30cc of sterile water  were injected into pleural space using existing pleural catheter.  Catheter will be clamped for 1 hour and then placed back to suction.   Complications/Tolerance None; patient tolerated the procedure well.   EBL None   Specimen(s) None   Jonathan Jimenez V. Jude MD

## 2023-11-22 DIAGNOSIS — J189 Pneumonia, unspecified organism: Secondary | ICD-10-CM | POA: Diagnosis not present

## 2023-11-22 DIAGNOSIS — J869 Pyothorax without fistula: Secondary | ICD-10-CM | POA: Diagnosis not present

## 2023-11-22 DIAGNOSIS — J90 Pleural effusion, not elsewhere classified: Secondary | ICD-10-CM | POA: Diagnosis not present

## 2023-11-22 LAB — CBC
HCT: 38.3 % — ABNORMAL LOW (ref 39.0–52.0)
Hemoglobin: 12.6 g/dL — ABNORMAL LOW (ref 13.0–17.0)
MCH: 30.8 pg (ref 26.0–34.0)
MCHC: 32.9 g/dL (ref 30.0–36.0)
MCV: 93.6 fL (ref 80.0–100.0)
Platelets: 333 10*3/uL (ref 150–400)
RBC: 4.09 MIL/uL — ABNORMAL LOW (ref 4.22–5.81)
RDW: 13.7 % (ref 11.5–15.5)
WBC: 15.3 10*3/uL — ABNORMAL HIGH (ref 4.0–10.5)
nRBC: 0 % (ref 0.0–0.2)

## 2023-11-22 LAB — BASIC METABOLIC PANEL WITH GFR
Anion gap: 11 (ref 5–15)
BUN: 33 mg/dL — ABNORMAL HIGH (ref 8–23)
CO2: 21 mmol/L — ABNORMAL LOW (ref 22–32)
Calcium: 8 mg/dL — ABNORMAL LOW (ref 8.9–10.3)
Chloride: 103 mmol/L (ref 98–111)
Creatinine, Ser: 1.2 mg/dL (ref 0.61–1.24)
GFR, Estimated: >60 mL/min (ref >60)
Glucose, Bld: 151 mg/dL — ABNORMAL HIGH (ref 70–99)
Potassium: 3.4 mmol/L — ABNORMAL LOW (ref 3.5–5.1)
Sodium: 135 mmol/L (ref 135–145)

## 2023-11-22 LAB — GLUCOSE, CAPILLARY
Glucose-Capillary: 174 mg/dL — ABNORMAL HIGH (ref 70–99)
Glucose-Capillary: 152 mg/dL — ABNORMAL HIGH (ref 70–99)
Glucose-Capillary: 178 mg/dL — ABNORMAL HIGH (ref 70–99)
Glucose-Capillary: 148 mg/dL — ABNORMAL HIGH (ref 70–99)

## 2023-11-22 MED ORDER — METHOCARBAMOL 500 MG PO TABS
500.0000 mg | ORAL_TABLET | Freq: Three times a day (TID) | ORAL | Status: DC | PRN
  Administered 2023-11-22 – 2023-11-23 (×2): 500 mg via ORAL
  Filled 2023-11-22 (×3): qty 1

## 2023-11-22 MED ORDER — DICLOFENAC SODIUM 1 % EX GEL
2.0000 g | Freq: Four times a day (QID) | CUTANEOUS | Status: DC
  Administered 2023-11-22 – 2023-11-25 (×14): 2 g via TOPICAL
  Filled 2023-11-22: qty 100

## 2023-11-22 NOTE — Progress Notes (Signed)
   NAME:  Jonathan Jimenez, MRN:  [id number], DOB:  September 09, 1945, LOS: 4 ADMISSION DATE:  11/18/2023, CONSULTATION DATE:  11/19/23  REFERRING MD:  Dr Briana, CHIEF COMPLAINT:  loculated pleural effusion   History of Present Illness:  Asked to see patient for a loculated pleural effusion  He had thoracentesis 6/19 430 cc of hazy yellow fluid  Came in with a couple of days history of chest discomfort.  Had a chest x-ray and a CT scan of the chest showing a moderately large loculated pleural effusion with underlying pneumonia   Never smoker Worked as a Music therapist  Pertinent  Medical History   diabetes, chronic kidney disease stage IIIa, hyperlipidemia   Significant Hospital Events: Including procedures, antibiotic start and stop dates in addition to other pertinent events   11/18/2023-CT scan of the chest showing loculated moderate effusion 6/19 lytics x 1 6/20 #2 6/21 #3  Interim History / Subjective:   1 L drainage last 24 hours Afebrile Complains of back pain, specially on deep breath   Objective    Blood pressure 118/62, pulse 84, temperature 98.8 F (37.1 C), temperature source Oral, resp. rate 16, height 5' 9 (1.753 m), weight 102.1 kg, SpO2 94%.    FiO2 (%):  [21 %] 21 %   Intake/Output Summary (Last 24 hours) at 11/22/2023 1048 Last data filed at 11/22/2023 9089 Gross per 24 hour  Intake 1151.81 ml  Output 2100 ml  Net -948.19 ml   Filed Weights   11/18/23 1450 11/18/23 2145  Weight: 102.1 kg 102.1 kg    Examination: Unchanged General: Elderly,  appears comfortable HENT: Moist oral mucosa Lungs: Decreased breath sounds on right, some chunks on pigtail, serosanguineous fluid Cardiovascular: S1-S2 regular, no murmur Abdomen: Soft, bowel sounds appreciated Extremities: No clubbing, no edema Neuro: Alert and oriented x 3   Labs show decreasing leukocytosis, stable hemoglobin, mild hypokalemia Chest x-ray shows improved aeration right base  Urine strep antigen  negative Body fluid culture shows rare strep  Resolved problem list   Assessment and Plan   Multilobar pneumonia of right lung Community-acquired pneumonia - Continue azithromycin  and ceftriaxone  - Cultures negative  Right empyema -likely anaerobic strep, status post lytics x 3 with good drainage and improvement on chest x-ray Hopeful to get full resolution, can DC pigtail once drainage decreases Chest x-ray tomorrow Fluid culture negative so far Flush pigtail per protocol  Harden Staff MD. FCCP.  Pulmonary & Critical care Pager : [phone]  If no response to pager , please call [phone] until 7 pm After 7:00 pm call Elink  [phone]   11/22/2023

## 2023-11-22 NOTE — Plan of Care (Signed)

## 2023-11-22 NOTE — Progress Notes (Signed)
 Mobility Specialist - Progress Note  ([facility] 3L) Pre-mobility: 84 bpm HR, 95% SpO2 During mobility: 93 bpm HR, 92% SpO2 Post-mobility: 83 bpm HR, 97% SPO2   11/22/23 1522  Mobility  Activity Ambulated with assistance in hallway  Level of Assistance Minimal assist, patient does 75% or more  Assistive Device Front wheel walker  Distance Ambulated (ft) 50 ft  Range of Motion/Exercises Active  Activity Response Tolerated fair  Mobility Referral Yes  Mobility visit 1 Mobility  Mobility Specialist Start Time (ACUTE ONLY) 1505  Mobility Specialist Stop Time (ACUTE ONLY) 1522  Mobility Specialist Time Calculation (min) (ACUTE ONLY) 17 min   Pt was found in bed and agreeable to ambulate with encouragement. Pt grew fatigued with session and returned to bed with all needs met. Call bell in reach and RN notified.  Erminio Leos,  Mobility Specialist Can be reached via Secure Chat

## 2023-11-22 NOTE — Progress Notes (Signed)
 PROGRESS NOTE    Kaitlin Ardito  FMW:[id number] DOB: Nov 08, 1945 DOA: 11/18/2023 PCP: Larnell Hamilton, MD   Brief Narrative: Jonathan Jimenez is a 78 y.o. male with a history of hypertension, CKD stage IIIa, diabetes mellitus type 2, hyperlipidemia.  Patient presented secondary to right sided back pain with associated shortness of breath and was found to have pneumonia with a right-sided loculated pleural effusion. Empiric antibiotics started. Pulmonology consulted for loculated effusion and consulted IR for right chest tube placement, which was successfully performed on 6/19.   Assessment and Plan:  Community acquired pneumonia Present on admission. Blood cultures obtained on admission. Patient started empirically on ceftriaxone  and azithromycin . Blood cultures with no growth to date. Leukocytosis improving. Pleural fluid culture growing streptococcus species -Continue Ceftriaxone  and azithromycin  -Follow-up blood cultures  Severe sepsis Present on admission. Secondary to pneumonia.  Right-sided loculated pleural effusion Likely secondary to pneumonia. IR consulted for thoracentesis but only yielded 130 mL of fluid; cultures and labs sent. Pulmonology consulted and consulted IR for right chest tube placement, which was performed on 6/19. Lytics x2 per pulmonology. -Follow-up pleural fluid labs -Pulmonology recommendations: chest tube management  Acute respiratory failure with hypoxia SpO2 down to 87% on room air. Patient placed on nasal canula. Rate increased to 5 L/min for ongoing hypoxia. -Continue oxygen therapy; if continues to worsen, will need to transfer to progressive care -Wean to room air as able -Incentive spirometer -PT/OT  Primary hypertension Noted. Patient is on amlodipine, triamterene-hydrochlorothiazide and Coreg  as an outpatient. All medication held on admission. -Continue Coreg   AKI on CKD stage IIIa Unknown baseline creatinine, however, Creatinine of 1.91 on  admission likely above baseline as he has a history of only stage IIIa disease. Triamterene-hydrochlorothiazide held on admission. Creatinine improved to 1.22 but now acutely worsened back up to 1.52. patient admits to poor fluid intake.  Diabetes mellitus, type 2 Well controlled based on hemoglobin A1C of 6.3%. Patient is managed on metformin as an outpatient.  Hyperlipidemia -Continue simvastatin   Hypokalemia Potassium supplementation given.   DVT prophylaxis: Subcutaneous heparin  Code Status:   Code Status: Full Code Family Communication: None at bedside. Disposition Plan: Discharge home likely not for several days pending ongoing management of pleural effusion   Consultants:  Pulmonology  Procedures:  Thoracentesis Right chest tube placement  Antimicrobials: Ceftriaxone  Azithromycin     Subjective: Some right sided back pain, below site of chest tube..  Objective: BP 118/62 (BP Location: Right Arm)   Pulse 84   Temp 98.8 F (37.1 C) (Oral)   Resp 16   Ht 5' 9 (1.753 m)   Wt 102.1 kg   SpO2 94%   BMI 33.24 kg/m   Examination:  General exam: Appears calm and comfortable Respiratory system: Rales on right. Respiratory effort normal. Cardiovascular system: S1 & S2 heard, RRR.  Gastrointestinal system: Abdomen is nondistended, soft and nontender. Normal bowel sounds heard. Central nervous system: Alert and oriented. No focal neurological deficits. Psychiatry: Judgement and insight appear normal. Mood & affect appropriate.    Data Reviewed: I have personally reviewed following labs and imaging studies  CBC Lab Results  Component Value Date   WBC 15.3 (H) 11/22/2023   RBC 4.09 (L) 11/22/2023   HGB 12.6 (L) 11/22/2023   HCT 38.3 (L) 11/22/2023   MCV 93.6 11/22/2023   MCH 30.8 11/22/2023   PLT 333 11/22/2023   MCHC 32.9 11/22/2023   RDW 13.7 11/22/2023     Last metabolic panel Lab Results  Component Value Date  NA 135 11/22/2023   K 3.4 (L)  11/22/2023   CL 103 11/22/2023   CO2 21 (L) 11/22/2023   BUN 33 (H) 11/22/2023   CREATININE 1.20 11/22/2023   GLUCOSE 151 (H) 11/22/2023   GFRNONAA >60 11/22/2023   CALCIUM 8.0 (L) 11/22/2023   PROT 6.3 (L) 11/19/2023   ALBUMIN 2.7 (L) 11/19/2023   BILITOT 1.1 11/19/2023   ALKPHOS 61 11/19/2023   AST 15 11/19/2023   ALT 14 11/19/2023   ANIONGAP 11 11/22/2023    GFR: Estimated Creatinine Clearance: 62.7 mL/min (by C-G formula based on SCr of 1.2 mg/dL).  Recent Results (from the past 240 hours)  Blood culture (routine x 2)     Status: None (Preliminary result)   Collection Time: 11/18/23  3:59 PM   Specimen: BLOOD  Result Value Ref Range Status   Specimen Description   Final    BLOOD LEFT ANTECUBITAL Performed at [facility], [address], [place], [place] [address]    Special Requests   Final    Blood Culture adequate volume BOTTLES DRAWN AEROBIC AND ANAEROBIC Performed at [facility], [address], [place], [place] [address]    Culture   Final    NO GROWTH 4 DAYS Performed at [facility], [address] [place], [place] [address]    Report Status PENDING  Incomplete  Blood culture (routine x 2)     Status: None (Preliminary result)   Collection Time: 11/18/23  4:04 PM   Specimen: BLOOD  Result Value Ref Range Status   Specimen Description   Final    BLOOD RIGHT ANTECUBITAL Performed at [facility], [address], [place], [place] [address]    Special Requests   Final    Blood Culture results may not be optimal due to an inadequate volume of blood received in culture bottles BOTTLES DRAWN AEROBIC AND ANAEROBIC Performed at [facility], [address], [place], [place] [address]    Culture   Final    NO GROWTH 4 DAYS Performed at [facility], [address], [place], [place] [address]    Report Status PENDING  Incomplete  Body fluid culture w Gram Stain      Status: None (Preliminary result)   Collection Time: 11/19/23 11:08 AM   Specimen: Pleura; Body Fluid  Result Value Ref Range Status   Specimen Description   Final    PLEURAL Performed at [facility], [address], [place], [place] [address]    Special Requests   Final    RIGHT Performed at [facility], [address] [place], [place] [address]    Gram Stain   Final    RARE WBC PRESENT, PREDOMINANTLY PMN NO ORGANISMS SEEN    Culture   Final    RARE STREPTOCOCCUS SPECIES IDENTIFICATION AND SUSCEPTIBILITIES TO FOLLOW CRITICAL RESULT CALLED TO, READ BACK BY AND VERIFIED WITH: RN NATE CAUSBY [phone] [id number] 1004 BY EC Performed at [facility], [address] [place], [place] [address]    Report Status PENDING  Incomplete      Radiology Studies: DG CHEST PORT 1 VIEW Result Date: 11/21/2023 CLINICAL DATA:  Empyema.  Chest tube placement EXAM: PORTABLE CHEST 1 VIEW COMPARISON:  11/20/2018 FINDINGS: Marked reduction in pleural fluid in the RIGHT hemithorax following chest tube placement. Chest tube coiled in the RIGHT lung base. Persistent RIGHT basilar atelectasis. No pneumothorax. LEFT lung remains relatively clear. IMPRESSION: Marked  reduction in RIGHT pleural fluid following chest tube placement. Electronically Signed   By: Jackquline Boxer M.D.   On: 11/21/2023 10:40      LOS: 4 days    Elgin Lam, MD [facility] 11/22/2023, 12:01 PM   If 7PM-7AM, please contact night-coverage [email or web address]

## 2023-11-23 ENCOUNTER — Inpatient Hospital Stay (HOSPITAL_COMMUNITY)

## 2023-11-23 DIAGNOSIS — J90 Pleural effusion, not elsewhere classified: Secondary | ICD-10-CM | POA: Diagnosis not present

## 2023-11-23 DIAGNOSIS — J869 Pyothorax without fistula: Secondary | ICD-10-CM | POA: Diagnosis not present

## 2023-11-23 DIAGNOSIS — J9601 Acute respiratory failure with hypoxia: Secondary | ICD-10-CM | POA: Diagnosis not present

## 2023-11-23 DIAGNOSIS — J189 Pneumonia, unspecified organism: Secondary | ICD-10-CM | POA: Diagnosis not present

## 2023-11-23 LAB — BASIC METABOLIC PANEL WITH GFR
Anion gap: 10 (ref 5–15)
BUN: 33 mg/dL — ABNORMAL HIGH (ref 8–23)
CO2: 23 mmol/L (ref 22–32)
Calcium: 7.9 mg/dL — ABNORMAL LOW (ref 8.9–10.3)
Chloride: 104 mmol/L (ref 98–111)
Creatinine, Ser: 1.37 mg/dL — ABNORMAL HIGH (ref 0.61–1.24)
GFR, Estimated: 54 mL/min — ABNORMAL LOW (ref >60)
Glucose, Bld: 137 mg/dL — ABNORMAL HIGH (ref 70–99)
Potassium: 3.4 mmol/L — ABNORMAL LOW (ref 3.5–5.1)
Sodium: 137 mmol/L (ref 135–145)

## 2023-11-23 LAB — CULTURE, BLOOD (ROUTINE X 2)
Culture: NO GROWTH
Culture: NO GROWTH
Special Requests: ADEQUATE

## 2023-11-23 LAB — GLUCOSE, CAPILLARY
Glucose-Capillary: 142 mg/dL — ABNORMAL HIGH (ref 70–99)
Glucose-Capillary: 166 mg/dL — ABNORMAL HIGH (ref 70–99)
Glucose-Capillary: 168 mg/dL — ABNORMAL HIGH (ref 70–99)
Glucose-Capillary: 162 mg/dL — ABNORMAL HIGH (ref 70–99)

## 2023-11-23 LAB — CBC
HCT: 37.2 % — ABNORMAL LOW (ref 39.0–52.0)
Hemoglobin: 11.9 g/dL — ABNORMAL LOW (ref 13.0–17.0)
MCH: 29.8 pg (ref 26.0–34.0)
MCHC: 32 g/dL (ref 30.0–36.0)
MCV: 93 fL (ref 80.0–100.0)
Platelets: 324 10*3/uL (ref 150–400)
RBC: 4 MIL/uL — ABNORMAL LOW (ref 4.22–5.81)
RDW: 13.7 % (ref 11.5–15.5)
WBC: 17 10*3/uL — ABNORMAL HIGH (ref 4.0–10.5)
nRBC: 0 % (ref 0.0–0.2)

## 2023-11-23 MED ORDER — SODIUM CHLORIDE 0.9 % IV SOLN: INTRAVENOUS | Status: AC

## 2023-11-23 MED ORDER — SODIUM CHLORIDE 0.9% FLUSH
10.0000 mL | Freq: Three times a day (TID) | INTRAVENOUS | Status: DC
  Administered 2023-11-23 – 2023-11-26 (×8): 10 mL via INTRAPLEURAL

## 2023-11-23 NOTE — TOC Progression Note (Signed)
 Transition of Care [facility]) - Progression Note    Patient Details  Name: Jonathan Jimenez MRN: [id number] Date of Birth: [birth year]  Transition [facility]) CM/SW Contact  Jonathan Jimenez, [name], RN Phone Number: 11/23/2023, 12:30 PM  Clinical Narrative: Patient w/no preference for dme company-[employer] rw rep jermaine to [place] to rm prior d/c. Noted on 02-monitor if needed @ home can arrange w/02 sats & home 02 order. Has own transport home.      Expected Discharge Plan: Home w Home Health Services Barriers to Discharge: Continued Medical Work up  Expected Discharge Plan and Services   Discharge Planning Services: CM Consult Post Acute Care Choice: Home Health Living arrangements for the past 2 months: Single Family Home                 DME Arranged: Walker rolling DME Agency: [employer] Date DME Agency Contacted: 11/23/23 Time DME Agency Contacted: 1230 Representative spoke with at DME Agency: [place] HH Arranged: PT HH Agency: [facility] Date [facility] Agency Contacted: 11/23/23 Time HH Agency Contacted: 1230     Social Determinants of Health (SDOH) Interventions SDOH Screenings   Food Insecurity: No Food Insecurity (11/18/2023)  Housing: Low Risk  (11/18/2023)  Transportation Needs: No Transportation Needs (11/18/2023)  Utilities: Not At Risk (11/18/2023)  Social Connections: Socially Integrated (11/18/2023)  Tobacco Use: Low Risk  (11/18/2023)    Readmission Risk Interventions     No data to display

## 2023-11-23 NOTE — Progress Notes (Signed)
 Physical Therapy Treatment Patient Details Name: Jonathan Jimenez MRN: [id number] DOB: 1946-05-06 Today's Date: 11/23/2023   History of Present Illness Patient is a 78 year old male who presented on 6/18 with SOB and R side back pain. Patient was admitted with pneumonia, severe sepsis, reside side loculated pleural effusion, acute respiratory failure with hypoxia, and HTN. PMH:CKD III, DM II, Hyperlipidmia.    PT Comments  Pt agreeable to working with therapy. Min-Mod A for mobility on today. O2 >90% on 3L during session. Encouraged pt to sit up in recliner but he declined. Will continue to follow.  Plan is for HHPT.     If plan is discharge home, recommend the following: A little help with walking and/or transfers;A little help with bathing/dressing/bathroom;Assistance with cooking/housework;Assist for transportation;Help with stairs or ramp for entrance   Can travel by private vehicle        Equipment Recommendations  Rolling walker (2 wheels)    Recommendations for Other Services       Precautions / Restrictions Precautions Precautions: Fall Precaution/Restrictions Comments: Chest tube in place on R; monitor O2 Restrictions Weight Bearing Restrictions Per Provider Order: No     Mobility  Bed Mobility Overal bed mobility: Needs Assistance Bed Mobility: Supine to Sit, Sit to Supine     Supine to sit: Mod assist, HOB elevated, Used rails Sit to supine: Min assist, HOB elevated, Used rails   General bed mobility comments: Increased time. Moderate use of bedrail and handheld assist provided to help with trunk to upright. Cues provided.    Transfers Overall transfer level: Needs assistance Equipment used: Rolling walker (2 wheels) Transfers: Sit to/from Stand Sit to Stand: Min assist, From elevated surface           General transfer comment: Small amount of power up assist. Cues for safety, hand placement.    Ambulation/Gait Ambulation/Gait assistance: Min assist Gait  Distance (Feet): 60 Feet Assistive device: Rolling walker (2 wheels) Gait Pattern/deviations: Step-through pattern, Decreased stride length       General Gait Details: Slow gait speed. No overt LOB with RW. O2 93% on 3L with ambulation. Pt self limits distance.   Stairs             Wheelchair Mobility     Tilt Bed    Modified Rankin (Stroke Patients Only)       Balance Overall balance assessment: Needs assistance         Standing balance support: Bilateral upper extremity supported, During functional activity, Reliant on assistive device for balance Standing balance-Leahy Scale: Fair                              Hotel manager: No apparent difficulties  Cognition Arousal: Alert Behavior During Therapy: WFL for tasks assessed/performed   PT - Cognitive impairments: No apparent impairments                         Following commands: Intact      Cueing Cueing Techniques: Verbal cues  Exercises      General Comments        Pertinent Vitals/Pain Pain Assessment Pain Assessment: Faces Faces Pain Scale: Hurts little more Pain Location: back Pain Descriptors / Indicators: Aching Pain Intervention(s): Limited activity within patient's tolerance, Monitored during session, Repositioned    Home Living  Prior Function            PT Goals (current goals can now be found in the care plan section) Progress towards PT goals: Progressing toward goals    Frequency    Min 3X/week      PT Plan      Co-evaluation              AM-PAC PT 6 Clicks Mobility   Outcome Measure  Help needed turning from your back to your side while in a flat bed without using bedrails?: A Lot Help needed moving from lying on your back to sitting on the side of a flat bed without using bedrails?: A Lot Help needed moving to and from a bed to a chair (including a wheelchair)?: A  Little Help needed standing up from a chair using your arms (e.g., wheelchair or bedside chair)?: A Little Help needed to walk in hospital room?: A Little Help needed climbing 3-5 steps with a railing? : A Lot 6 Click Score: 15    End of Session Equipment Utilized During Treatment: Oxygen Activity Tolerance: Patient tolerated treatment well Patient left: in bed;with call bell/phone within reach;with bed alarm set   PT Visit Diagnosis: Unsteadiness on feet (R26.81);Difficulty in walking, not elsewhere classified (R26.2)     Time: 8753-8692 PT Time Calculation (min) (ACUTE ONLY): 21 min  Charges:    $Gait Training: 8-22 mins PT General Charges $$ ACUTE PT VISIT: 1 Visit                        Dannial SQUIBB, PT Acute Rehabilitation  Office: [phone]

## 2023-11-23 NOTE — Progress Notes (Signed)
 PROGRESS NOTE    Jonathan Jimenez  FMW:[id number] DOB: 01-09-1946 DOA: 11/18/2023 PCP: Larnell Hamilton, MD   Brief Narrative: Jonathan Jimenez is a 78 y.o. male with a history of hypertension, CKD stage IIIa, diabetes mellitus type 2, hyperlipidemia.  Patient presented secondary to right sided back pain with associated shortness of breath and was found to have pneumonia with a right-sided loculated pleural effusion. Empiric antibiotics started. Pulmonology consulted for loculated effusion and consulted IR for right chest tube placement, which was successfully performed on 6/19.   Assessment and Plan:  Community acquired pneumonia Present on admission. Blood cultures obtained on admission. Patient started empirically on ceftriaxone  and azithromycin . Blood cultures with no growth to date. Leukocytosis with initial improvement, now slightly worsened today; no fevers. Pleural fluid culture growing streptococcus species. Azithromycin  discontinued. -Continue Ceftriaxone  -Follow-up blood cultures  Severe sepsis Present on admission. Secondary to pneumonia.  Right-sided loculated pleural effusion Right-sided empyema Likely secondary to pneumonia. IR consulted for thoracentesis but only yielded 130 mL of fluid; cultures and labs sent. Pulmonology consulted and consulted IR for right chest tube placement, which was performed on 6/19. Lytics x3 per pulmonology. -Follow-up pleural fluid labs -Pulmonology recommendations: chest tube management, repeat chest x-ray in AM  Acute respiratory failure with hypoxia SpO2 down to 87% on room air. Patient placed on nasal canula. Rate increased to 5 L/min for ongoing hypoxia. -Wean to room air as able -Incentive spirometer -PT/OT  Primary hypertension Noted. Patient is on amlodipine, triamterene-hydrochlorothiazide and Coreg  as an outpatient. All medication held on admission. -Continue Coreg   AKI on CKD stage IIIa Unknown baseline creatinine, however, Creatinine  of 1.91 on admission likely above baseline as he has a history of only stage IIIa disease. Triamterene-hydrochlorothiazide held on admission. Creatinine improved to 1.22 but now acutely worsened back up to 1.52. patient admits to poor fluid intake.  Diabetes mellitus, type 2 Well controlled based on hemoglobin A1C of 6.3%. Patient is managed on metformin as an outpatient.  Hyperlipidemia -Continue simvastatin   Hypokalemia Potassium supplementation given.   DVT prophylaxis: Subcutaneous heparin  Code Status:   Code Status: Full Code Family Communication: Friend at bedside. Disposition Plan: Discharge home likely 2-3 days pending removal of chest tube and ongoing pulmonology recommendations   Consultants:  Pulmonology  Procedures:  Thoracentesis Right chest tube placement  Antimicrobials: Ceftriaxone  Azithromycin     Subjective: Feeling better this morning. No issues overnight.  Objective: BP (!) 110/51 (BP Location: Right Arm)   Pulse 72   Temp 97.9 F (36.6 C) (Oral)   Resp 16   Ht 5' 9 (1.753 m)   Wt 102.1 kg   SpO2 96%   BMI 33.24 kg/m   Examination:  General exam: Appears calm and comfortable Respiratory system: Right-sided rales. Respiratory effort normal. Cardiovascular system: S1 & S2 heard, RRR. Gastrointestinal system: Abdomen is mildly distended, soft and nontender. Normal bowel sounds heard. Central nervous system: Alert and oriented. No focal neurological deficits. Musculoskeletal: No edema. No calf tenderness Psychiatry: Judgement and insight appear normal. Mood & affect appropriate.    Data Reviewed: I have personally reviewed following labs and imaging studies  CBC Lab Results  Component Value Date   WBC 17.0 (H) 11/23/2023   RBC 4.00 (L) 11/23/2023   HGB 11.9 (L) 11/23/2023   HCT 37.2 (L) 11/23/2023   MCV 93.0 11/23/2023   MCH 29.8 11/23/2023   PLT 324 11/23/2023   MCHC 32.0 11/23/2023   RDW 13.7 11/23/2023     Last metabolic  panel  Lab Results  Component Value Date   NA 137 11/23/2023   K 3.4 (L) 11/23/2023   CL 104 11/23/2023   CO2 23 11/23/2023   BUN 33 (H) 11/23/2023   CREATININE 1.37 (H) 11/23/2023   GLUCOSE 137 (H) 11/23/2023   GFRNONAA 54 (L) 11/23/2023   CALCIUM 7.9 (L) 11/23/2023   PROT 6.3 (L) 11/19/2023   ALBUMIN 2.7 (L) 11/19/2023   BILITOT 1.1 11/19/2023   ALKPHOS 61 11/19/2023   AST 15 11/19/2023   ALT 14 11/19/2023   ANIONGAP 10 11/23/2023    GFR: Estimated Creatinine Clearance: 54.9 mL/min (A) (by C-G formula based on SCr of 1.37 mg/dL (H)).  Recent Results (from the past 240 hours)  Blood culture (routine x 2)     Status: None   Collection Time: 11/18/23  3:59 PM   Specimen: BLOOD  Result Value Ref Range Status   Specimen Description   Final    BLOOD LEFT ANTECUBITAL Performed at [facility], [address] [place], [place] [address]    Special Requests   Final    Blood Culture adequate volume BOTTLES DRAWN AEROBIC AND ANAEROBIC Performed at [facility], [address] [place], [place] [address]    Culture   Final    NO GROWTH 5 DAYS Performed at [facility], [address], [place], [place] [address]    Report Status 11/23/2023 FINAL  Final  Blood culture (routine x 2)     Status: None   Collection Time: 11/18/23  4:04 PM   Specimen: BLOOD  Result Value Ref Range Status   Specimen Description   Final    BLOOD RIGHT ANTECUBITAL Performed at [facility], [address], [place], [place] [address]    Special Requests   Final    Blood Culture results may not be optimal due to an inadequate volume of blood received in culture bottles BOTTLES DRAWN AEROBIC AND ANAEROBIC Performed at [facility], [address] [place], [place] [address]    Culture   Final    NO GROWTH 5 DAYS Performed at [facility], [address], [place], [place] [address]    Report Status 11/23/2023  FINAL  Final  Body fluid culture w Gram Stain     Status: None (Preliminary result)   Collection Time: 11/19/23 11:08 AM   Specimen: Pleura; Body Fluid  Result Value Ref Range Status   Specimen Description   Final    PLEURAL Performed at [facility] [place] [facility], [address] [place], [place] [address]    Special Requests   Final    RIGHT Performed at [facility], [address] [place], [place] [address]    Gram Stain   Final    RARE WBC PRESENT, PREDOMINANTLY PMN NO ORGANISMS SEEN    Culture   Final    RARE STREPTOCOCCUS SPECIES IDENTIFICATION AND SUSCEPTIBILITIES TO FOLLOW CRITICAL RESULT CALLED TO, READ BACK BY AND VERIFIED WITH: RN NATE CAUSBY [phone] AT 1004 BY EC Performed at [facility], [address] [place], [place] [address]    Report Status PENDING  Incomplete      Radiology Studies: DG CHEST PORT 1 VIEW Result Date: 11/23/2023 CLINICAL DATA:  Acute respiratory failure with hypoxia EXAM: PORTABLE CHEST 1 VIEW COMPARISON:  11/21/2023 FINDINGS: Right-sided pleural pigtail catheter is similar in position. Midline trachea. Cardiomegaly accentuated by AP portable technique. Trace right pleural fluid or thickening blunts the costophrenic angle.  No pneumothorax. Right mid and lower lung airspace disease is minimally improved. IMPRESSION: Right-sided pigtail pleural catheter remaining in place, without pneumothorax. Slight improvement in right lung aeration. Electronically Signed   By: Rockey Kilts M.D.   On: 11/23/2023 08:34      LOS: 5 days    Elgin Lam, MD [facility] 11/23/2023, 10:08 AM   If 7PM-7AM, please contact night-coverage [email or web address]

## 2023-11-23 NOTE — Progress Notes (Signed)
   NAME:  Jonathan Jimenez, MRN:  [id number], DOB:  1946/01/17, LOS: 5 ADMISSION DATE:  11/18/2023, CONSULTATION DATE:  11/19/23  REFERRING MD:  Dr Briana, CHIEF COMPLAINT:  loculated pleural effusion   History of Present Illness:  Asked to see patient for a loculated pleural effusion  He had thoracentesis 6/19 430 cc of hazy yellow fluid  Came in with a couple of days history of chest discomfort.  Had a chest x-ray and a CT scan of the chest showing a moderately large loculated pleural effusion with underlying pneumonia   Never smoker Worked as a Music therapist  Pertinent  Medical History   diabetes, chronic kidney disease stage IIIa, hyperlipidemia   Significant Hospital Events: Including procedures, antibiotic start and stop dates in addition to other pertinent events   11/18/2023-CT scan of the chest showing loculated moderate effusion 6/19 lytics x 1 6/20 #2 6/21 #3  Interim History / Subjective:  S/p 3 days of thrombolytic therapy About 1L out in the last 24 hours.  Feels pain is improved.   Objective    Blood pressure (!) 110/51, pulse 72, temperature 97.9 F (36.6 C), temperature source Oral, resp. rate 16, height 5' 9 (1.753 m), weight 102.1 kg, SpO2 96%.        Intake/Output Summary (Last 24 hours) at 11/23/2023 9090 Last data filed at 11/23/2023 0554 Gross per 24 hour  Intake 490 ml  Output 3325 ml  Net -2835 ml   Filed Weights   11/18/23 1450 11/18/23 2145  Weight: 102.1 kg 102.1 kg    Examination: Unchanged General: elderly man, no respiratory distress laying flat HENT: mmm Lungs: breath sounds decreased on the right, pigtail in place with serosanguinous drainage and some fibrinous material Cardiovascular: RRR Abdomen: soft, nontender Extremities: no peripheral edema Neuro: AO x4, no focal asymmetry   Labs show decreasing leukocytosis, stable hemoglobin, mild hypokalemia Chest x-ray shows improved aeration right base  Urine strep antigen negative Body  fluid culture shows rare strep  Resolved problem list   Assessment and Plan   Multilobar pneumonia of right lung Right empyema  Community-acquired pneumonia - Continue ceftriaxone  total 5 days.  - blood cultures positive for strep. Will d/c azithromycin  -status post lytics x 3 with good drainage and improvement on chest x-ray - chest xray reviewed today shows improved pleural effusion but still over 1L of drainage. Continue chest tube to low wall suction. Will repeat chest xray in am. Not ready for tube to come out yet.  - will repeat chest xray in am  PCCM will follow  Verdon Gore, MD Pulmonary and Critical Care Medicine [facility] 11/23/2023 9:17 AM Pager: see AMION  If no response to pager, please call critical care on call (see AMION) until 7pm After 7:00 pm call Elink

## 2023-11-24 ENCOUNTER — Inpatient Hospital Stay (HOSPITAL_COMMUNITY)

## 2023-11-24 ENCOUNTER — Encounter (HOSPITAL_COMMUNITY): Payer: Self-pay | Admitting: Internal Medicine

## 2023-11-24 DIAGNOSIS — J189 Pneumonia, unspecified organism: Secondary | ICD-10-CM | POA: Diagnosis not present

## 2023-11-24 DIAGNOSIS — J90 Pleural effusion, not elsewhere classified: Secondary | ICD-10-CM | POA: Diagnosis not present

## 2023-11-24 DIAGNOSIS — J9601 Acute respiratory failure with hypoxia: Secondary | ICD-10-CM | POA: Diagnosis not present

## 2023-11-24 DIAGNOSIS — J869 Pyothorax without fistula: Secondary | ICD-10-CM | POA: Diagnosis not present

## 2023-11-24 LAB — CBC
HCT: 35.7 % — ABNORMAL LOW (ref 39.0–52.0)
Hemoglobin: 11.7 g/dL — ABNORMAL LOW (ref 13.0–17.0)
MCH: 31.2 pg (ref 26.0–34.0)
MCHC: 32.8 g/dL (ref 30.0–36.0)
MCV: 95.2 fL (ref 80.0–100.0)
Platelets: 320 10*3/uL (ref 150–400)
RBC: 3.75 MIL/uL — ABNORMAL LOW (ref 4.22–5.81)
RDW: 13.7 % (ref 11.5–15.5)
WBC: 14 10*3/uL — ABNORMAL HIGH (ref 4.0–10.5)
nRBC: 0 % (ref 0.0–0.2)

## 2023-11-24 LAB — BASIC METABOLIC PANEL WITH GFR
Anion gap: 8 (ref 5–15)
BUN: <5 mg/dL — ABNORMAL LOW (ref 8–23)
CO2: 21 mmol/L — ABNORMAL LOW (ref 22–32)
Calcium: 7.5 mg/dL — ABNORMAL LOW (ref 8.9–10.3)
Chloride: 106 mmol/L (ref 98–111)
Creatinine, Ser: 1.01 mg/dL (ref 0.61–1.24)
GFR, Estimated: >60 mL/min (ref >60)
Glucose, Bld: 140 mg/dL — ABNORMAL HIGH (ref 70–99)
Potassium: 3.3 mmol/L — ABNORMAL LOW (ref 3.5–5.1)
Sodium: 135 mmol/L (ref 135–145)

## 2023-11-24 LAB — GLUCOSE, CAPILLARY
Glucose-Capillary: 139 mg/dL — ABNORMAL HIGH (ref 70–99)
Glucose-Capillary: 210 mg/dL — ABNORMAL HIGH (ref 70–99)
Glucose-Capillary: 161 mg/dL — ABNORMAL HIGH (ref 70–99)
Glucose-Capillary: 147 mg/dL — ABNORMAL HIGH (ref 70–99)

## 2023-11-24 MED ORDER — POTASSIUM CHLORIDE CRYS ER 20 MEQ PO TBCR
40.0000 meq | EXTENDED_RELEASE_TABLET | ORAL | Status: AC
  Administered 2023-11-24 (×2): 40 meq via ORAL
  Filled 2023-11-24 (×2): qty 2

## 2023-11-24 MED ORDER — FUROSEMIDE 10 MG/ML IJ SOLN
40.0000 mg | Freq: Once | INTRAMUSCULAR | Status: AC
  Administered 2023-11-24: 40 mg via INTRAVENOUS
  Filled 2023-11-24: qty 4

## 2023-11-24 NOTE — TOC Progression Note (Signed)
 Transition of Care [facility]) - Progression Note    Patient Details  Name: Jonathan Jimenez MRN: [id number] Date of Birth: [birth year]  Transition of Care [facility]) CM/SW Contact  Janene Yousuf, [name], RN Phone Number: 11/24/2023, 12:28 PM  Clinical Narrative:  [facility] rep Arna accepted for HHPT/OT;[employer] rw(delivered to rm prior d/c), may need home 02-will need 02 sats, order, & narrative note  for [employer] to deliver to rm prior d/c.      Expected Discharge Plan: Home w Home Health Services Barriers to Discharge: Continued Medical Work up  Expected Discharge Plan and Services   Discharge Planning Services: CM Consult Post Acute Care Choice: Home Health, Durable Medical Equipment ([facility]-HHPT/OT;[employer] rw, may need home 02) Living arrangements for the past 2 months: Single Family Home                 DME Arranged: Walker rolling DME Agency: [employer] Date DME Agency Contacted: 11/23/23 Time DME Agency Contacted: 1230 Representative spoke with at DME Agency: [place] HH Arranged: PT, OT HH Agency: [facility] Date [facility] Agency Contacted: 11/24/23 Time HH Agency Contacted: 1227 Representative spoke with at [facility] Agency: Arna   Social Determinants of Health (SDOH) Interventions SDOH Screenings   Food Insecurity: No Food Insecurity (11/18/2023)  Housing: Low Risk  (11/18/2023)  Transportation Needs: No Transportation Needs (11/18/2023)  Utilities: Not At Risk (11/18/2023)  Social Connections: Socially Integrated (11/18/2023)  Tobacco Use: Low Risk  (11/18/2023)    Readmission Risk Interventions     No data to display

## 2023-11-24 NOTE — Plan of Care (Signed)
   Problem: Education: Goal: Knowledge of General Education information will improve Description: Including pain rating scale, medication(s)/side effects and non-pharmacologic comfort measures Outcome: Progressing   Problem: Clinical Measurements: Goal: Respiratory complications will improve Outcome: Progressing   Problem: Activity: Goal: Risk for activity intolerance will decrease Outcome: Progressing

## 2023-11-24 NOTE — Plan of Care (Signed)

## 2023-11-24 NOTE — Progress Notes (Signed)
   NAME:  Jonathan Jimenez, MRN:  [id number], DOB:  [birth year], LOS: 6 ADMISSION DATE:  11/18/2023, CONSULTATION DATE:  11/19/23  REFERRING MD:  Dr Briana, CHIEF COMPLAINT:  loculated pleural effusion   History of Present Illness:  Asked to see patient for a loculated pleural effusion  He had thoracentesis 6/19 430 cc of hazy yellow fluid  Came in with a couple of days history of chest discomfort.  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   Objective    Blood pressure (!) 142/60, pulse 80, temperature 97.9 F (36.6 C), temperature source Oral, resp. rate 18, height 5' 9 (1.753 m), weight 102.1 kg, SpO2 97%.        Intake/Output Summary (Last 24 hours) at 11/24/2023 9166 Last data filed at 11/24/2023 0630 Gross per 24 hour  Intake 1625.67 ml  Output 950 ml  Net 675.67 ml   Filed Weights   11/18/23 1450 11/18/23 2145  Weight: 102.1 kg 102.1 kg    Examination: Unchanged General: elderly man, no respiratory distress laying flat HENT: mmm Lungs: breath sounds decreased on the right, pigtail in place with serosanguinous drainage and some fibrinous material Cardiovascular: RRR Abdomen: soft, nontender Extremities: no peripheral edema Neuro: AO x4, no focal asymmetry  Bedside ultrasound right pleural space this morning shows atelectatic lung, no substantial fluid above the diaphragm   Labs show decreasing  leukocytosis, stable hemoglobin, mild hypokalemia Chest x-ray this  morning stable compared to yesterday.   Urine strep antigen negative Body fluid culture shows rare strep  Resolved problem list   Assessment and Plan   Multilobar pneumonia of right lung Right empyema  Community-acquired pneumonia - Continue ceftriaxone  total 5 days.  - blood cultures positive for strep. Will d/c azithromycin  -status post lytics x 3 with good drainage and improvement on chest x-ray. Still large volume drainage. Bedside ultrasound shows improved  effusion. Will proceed with CT Chest to ensure eradication of empyema. He may just have some undiagnosed hfpef and this is residual transudative process.    PCCM will follow  Verdon Gore, MD Pulmonary and Critical Care Medicine [facility] 11/24/2023 8:33 AM Pager: see AMION  If no response to pager, please call critical care on call (see AMION) until 7pm After 7:00 pm call Elink

## 2023-11-24 NOTE — Progress Notes (Addendum)
 PROGRESS NOTE    Jonathan Jimenez  FMW:[id number] DOB: [birth year] DOA: 11/18/2023 PCP: Larnell Hamilton, MD   Brief Narrative: Jonathan Jimenez is a 78 y.o. male with a history of hypertension, CKD stage IIIa, diabetes mellitus type 2, hyperlipidemia.  Patient presented secondary to right sided back pain with associated shortness of breath and was found to have pneumonia with a right-sided loculated pleural effusion. Empiric antibiotics started. Pulmonology consulted for loculated effusion and consulted IR for right chest tube placement, which was successfully performed on 6/19.   Assessment and Plan:  Community acquired pneumonia Present on admission. Blood cultures obtained on admission. Patient started empirically on ceftriaxone  and azithromycin . Blood cultures with no growth to date. Leukocytosis improving; no fevers. Pleural fluid culture growing streptococcus species. Azithromycin  discontinued. -Continue Ceftriaxone   Severe sepsis Present on admission. Secondary to pneumonia.  Right-sided loculated pleural effusion Right-sided empyema Likely secondary to pneumonia. IR consulted for thoracentesis but only yielded 130 mL of fluid; cultures and labs sent. Pulmonology consulted and consulted IR for right chest tube placement, which was performed on 6/19. Lytics x3 per pulmonology. -Follow-up pleural fluid labs (sensitivities pending) -Pulmonology recommendations: chest tube management, pending today  Acute respiratory failure with hypoxia SpO2 down to 87% on room air. Patient placed on nasal canula. Rate increased to 5 L/min for ongoing hypoxia. PT/OT recommending home health services. -Wean to room air as able -Incentive spirometer -PT/OT  Primary hypertension Noted. Patient is on amlodipine, triamterene-hydrochlorothiazide and Coreg  as an outpatient. All medication held on admission. -Continue Coreg   AKI on CKD stage IIIa Unknown baseline creatinine, however, Creatinine of 1.91 on  admission likely above baseline as he has a history of only stage IIIa disease. Triamterene-hydrochlorothiazide held on admission. Creatinine improved to 1.22 but now acutely worsened back up to 1.52. patient admits to poor fluid intake.  Diabetes mellitus, type 2 Well controlled based on hemoglobin A1C of 6.3%. Patient is managed on metformin as an outpatient.  Hyperlipidemia -Continue simvastatin   Hypokalemia Potassium supplementation given.   DVT prophylaxis: Subcutaneous heparin  Code Status:   Code Status: Full Code Family Communication: Friend at bedside. Disposition Plan: Discharge home likely 2-3 days pending removal of chest tube and ongoing pulmonology recommendations   Consultants:  Pulmonology  Procedures:  Thoracentesis Right chest tube placement  Antimicrobials: Ceftriaxone  Azithromycin     Subjective: No issues overnight.   Objective: BP (!) 142/60 (BP Location: Right Arm)   Pulse 80   Temp 97.9 F (36.6 C) (Oral)   Resp 18   Ht 5' 9 (1.753 m)   Wt 102.1 kg   SpO2 97%   BMI 33.24 kg/m   Examination:  General exam: Appears calm and comfortable Respiratory system: Mild right-sided rales. Respiratory effort normal. Cardiovascular system: S1 & S2 heard, RRR. Gastrointestinal system: Abdomen is soft and nontender. Normal bowel sounds heard. Central nervous system: Alert and oriented. No focal neurological deficits. Musculoskeletal: No edema. No calf tenderness Psychiatry: Judgement and insight appear normal. Mood & affect appropriate.    Data Reviewed: I have personally reviewed following labs and imaging studies  CBC Lab Results  Component Value Date   WBC 14.0 (H) 11/24/2023   RBC 3.75 (L) 11/24/2023   HGB 11.7 (L) 11/24/2023   HCT 35.7 (L) 11/24/2023   MCV 95.2 11/24/2023   MCH 31.2 11/24/2023   PLT 320 11/24/2023   MCHC 32.8 11/24/2023   RDW 13.7 11/24/2023     Last metabolic panel Lab Results  Component Value Date   NA 135  11/24/2023   K 3.3 (L) 11/24/2023   CL 106 11/24/2023   CO2 21 (L) 11/24/2023   BUN <5 (L) 11/24/2023   CREATININE 1.01 11/24/2023   GLUCOSE 140 (H) 11/24/2023   GFRNONAA >60 11/24/2023   CALCIUM 7.5 (L) 11/24/2023   PROT 6.3 (L) 11/19/2023   ALBUMIN 2.7 (L) 11/19/2023   BILITOT 1.1 11/19/2023   ALKPHOS 61 11/19/2023   AST 15 11/19/2023   ALT 14 11/19/2023   ANIONGAP 8 11/24/2023    GFR: Estimated Creatinine Clearance: 74.5 mL/min (by C-G formula based on SCr of 1.01 mg/dL).  Recent Results (from the past 240 hours)  Blood culture (routine x 2)     Status: None   Collection Time: 11/18/23  3:59 PM   Specimen: BLOOD  Result Value Ref Range Status   Specimen Description   Final    BLOOD LEFT ANTECUBITAL Performed at [facility], [address] [place], [place] [address]    Special Requests   Final    Blood Culture adequate volume BOTTLES DRAWN AEROBIC AND ANAEROBIC Performed at [facility], [address] [place], [place] [address]    Culture   Final    NO GROWTH 5 DAYS Performed at [facility], [address], [place], [place] [address]    Report Status 11/23/2023 FINAL  Final  Blood culture (routine x 2)     Status: None   Collection Time: 11/18/23  4:04 PM   Specimen: BLOOD  Result Value Ref Range Status   Specimen Description   Final    BLOOD RIGHT ANTECUBITAL Performed at [facility], [address] [place], [place] [address]    Special Requests   Final    Blood Culture results may not be optimal due to an inadequate volume of blood received in culture bottles BOTTLES DRAWN AEROBIC AND ANAEROBIC Performed at [facility], [address], [place], [place] [address]    Culture   Final    NO GROWTH 5 DAYS Performed at [facility], [address] [place], [place] [address]    Report Status 11/23/2023 FINAL  Final  Body fluid culture w Gram Stain     Status: None  (Preliminary result)   Collection Time: 11/19/23 11:08 AM   Specimen: Pleura; Body Fluid  Result Value Ref Range Status   Specimen Description   Final    PLEURAL Performed at [facility], [address] [place], [place] [address]    Special Requests   Final    RIGHT Performed at [facility], [address], [place], [place] [address]    Gram Stain   Final    RARE WBC PRESENT, PREDOMINANTLY PMN NO ORGANISMS SEEN    Culture   Final    RARE STREPTOCOCCUS SPECIES REPEATING SENSITIVITY CRITICAL RESULT CALLED TO, READ BACK BY AND VERIFIED WITH: RN NATE CAUSBY [id number] 1004 BY EC Performed at [facility], [address] [place], [place] [address]    Report Status PENDING  Incomplete      Radiology Studies: DG CHEST PORT 1 VIEW Result Date: 11/23/2023 CLINICAL DATA:  Acute respiratory failure with hypoxia EXAM: PORTABLE CHEST 1 VIEW COMPARISON:  11/21/2023 FINDINGS: Right-sided pleural pigtail catheter is similar in position. Midline trachea. Cardiomegaly accentuated by AP portable technique. Trace right pleural fluid or thickening blunts the costophrenic angle. No pneumothorax. Right mid and lower lung airspace disease is minimally improved. IMPRESSION: Right-sided pigtail pleural  catheter remaining in place, without pneumothorax. Slight improvement in right lung aeration. Electronically Signed   By: Rockey Kilts M.D.   On: 11/23/2023 08:34      LOS: 6 days    Elgin Lam, MD [facility] 11/24/2023, 8:50 AM   If 7PM-7AM, please contact night-coverage [email or web address]

## 2023-11-25 ENCOUNTER — Inpatient Hospital Stay (HOSPITAL_COMMUNITY)

## 2023-11-25 DIAGNOSIS — N1831 Chronic kidney disease, stage 3a: Secondary | ICD-10-CM | POA: Diagnosis not present

## 2023-11-25 DIAGNOSIS — R0609 Other forms of dyspnea: Secondary | ICD-10-CM

## 2023-11-25 DIAGNOSIS — J189 Pneumonia, unspecified organism: Secondary | ICD-10-CM | POA: Diagnosis not present

## 2023-11-25 DIAGNOSIS — N179 Acute kidney failure, unspecified: Secondary | ICD-10-CM | POA: Diagnosis not present

## 2023-11-25 DIAGNOSIS — J9601 Acute respiratory failure with hypoxia: Secondary | ICD-10-CM | POA: Diagnosis not present

## 2023-11-25 DIAGNOSIS — R918 Other nonspecific abnormal finding of lung field: Secondary | ICD-10-CM | POA: Diagnosis not present

## 2023-11-25 LAB — CBC
HCT: 34.8 % — ABNORMAL LOW (ref 39.0–52.0)
Hemoglobin: 11.3 g/dL — ABNORMAL LOW (ref 13.0–17.0)
MCH: 30.4 pg (ref 26.0–34.0)
MCHC: 32.5 g/dL (ref 30.0–36.0)
MCV: 93.5 fL (ref 80.0–100.0)
Platelets: 333 10*3/uL (ref 150–400)
RBC: 3.72 MIL/uL — ABNORMAL LOW (ref 4.22–5.81)
RDW: 13.7 % (ref 11.5–15.5)
WBC: 12.4 10*3/uL — ABNORMAL HIGH (ref 4.0–10.5)
nRBC: 0 % (ref 0.0–0.2)

## 2023-11-25 LAB — BASIC METABOLIC PANEL WITH GFR
Anion gap: 8 (ref 5–15)
BUN: 23 mg/dL (ref 8–23)
CO2: 21 mmol/L — ABNORMAL LOW (ref 22–32)
Calcium: 7.5 mg/dL — ABNORMAL LOW (ref 8.9–10.3)
Chloride: 105 mmol/L (ref 98–111)
Creatinine, Ser: 1.15 mg/dL (ref 0.61–1.24)
GFR, Estimated: >60 mL/min (ref >60)
Glucose, Bld: 131 mg/dL — ABNORMAL HIGH (ref 70–99)
Potassium: 3.5 mmol/L (ref 3.5–5.1)
Sodium: 134 mmol/L — ABNORMAL LOW (ref 135–145)

## 2023-11-25 LAB — ECHOCARDIOGRAM COMPLETE
Area-P 1/2: 3.37 cm2
Height: 69 in
S' Lateral: 3.4 cm
Weight: 3601.6 [oz_av]

## 2023-11-25 LAB — GLUCOSE, CAPILLARY
Glucose-Capillary: 149 mg/dL — ABNORMAL HIGH (ref 70–99)
Glucose-Capillary: 162 mg/dL — ABNORMAL HIGH (ref 70–99)
Glucose-Capillary: 132 mg/dL — ABNORMAL HIGH (ref 70–99)

## 2023-11-25 MED ORDER — PERFLUTREN LIPID MICROSPHERE
1.0000 mL | INTRAVENOUS | Status: AC | PRN
  Administered 2023-11-25: 5 mL via INTRAVENOUS

## 2023-11-25 NOTE — Progress Notes (Signed)
 Chest tube intrapleural flushed with 10 cc, dressing clean and intact, no issues noted, will continue to monitor.

## 2023-11-25 NOTE — Progress Notes (Signed)
 Physical Therapy Treatment Patient Details Name: Jonathan Jimenez MRN: [id number] DOB: [birth year] Today's Date: 11/25/2023   History of Present Illness Patient is a 78 year old male who presented on 6/18 with SOB and R side back pain. Patient was admitted with pneumonia, severe sepsis, reside side loculated pleural effusion, acute respiratory failure with hypoxia, and HTN. PMH:CKD III, DM II, Hyperlipidmia.    PT Comments   Pt admitted with above diagnosis.  Pt currently with functional limitations due to the deficits listed below (see PT Problem List). Pt in bed when therapist arrived. Pt agreeable to therapy intervention. Pt indicated feeling sore back and R flank today. Pt required mod A for trunk with supine to sit and use of hospital bed, pt required CGA for sit to stand  from EOB with pull to stand at RW, gait tasks in hallway with RW, CGA and progressing to close S min cues for posture, RW management and breathing strategies with pt O2 saturation 88-92% on RA. Pt elected to return to bed and all needs in place. Pt will benefit from [facility] services at time of d/c and reported his son will be able to assist for a few days.  Pt will benefit from acute skilled PT to increase their independence and safety with mobility to allow discharge.      If plan is discharge home, recommend the following: A little help with walking and/or transfers;A little help with bathing/dressing/bathroom;Assistance with cooking/housework;Assist for transportation;Help with stairs or ramp for entrance   Can travel by private vehicle        Equipment Recommendations  Rolling walker (2 wheels)    Recommendations for Other Services       Precautions / Restrictions Precautions Precautions: Fall Precaution/Restrictions Comments: Chest tube in place on R; monitor O2 Restrictions Weight Bearing Restrictions Per Provider Order: No     Mobility  Bed Mobility Overal bed mobility: Needs Assistance Bed Mobility: Supine to Sit,  Sit to Supine     Supine to sit: Mod assist, HOB elevated, Used rails Sit to supine: Min assist, HOB elevated, Used rails   General bed mobility comments: Increased time. Moderate use of bedrail and handheld assist provided to help with trunk to upright adn pt able to manuver B LE to EOB, return to bed pt required min A for B LE. Cues provided.    Transfers Overall transfer level: Needs assistance Equipment used: Rolling walker (2 wheels) Transfers: Sit to/from Stand Sit to Stand: From elevated surface, Contact guard assist           General transfer comment: pull to stand x 4 at EOB with min cues    Ambulation/Gait Ambulation/Gait assistance: Supervision, Contact guard assist Gait Distance (Feet): 100 Feet Assistive device: Rolling walker (2 wheels) Gait Pattern/deviations: Step-through pattern, Decreased stride length Gait velocity: decreased     General Gait Details: pt able to progress from CGA to close S, min cues for RW management posture and coordinated breathing. pt O2 saturation 88-92% on RA and no reports of SOB   Stairs             Wheelchair Mobility     Tilt Bed    Modified Rankin (Stroke Patients Only)       Balance Overall balance assessment: Needs assistance Sitting-balance support: Feet supported Sitting balance-Leahy Scale: Good     Standing balance support: Bilateral upper extremity supported, During functional activity, Reliant on assistive device for balance Standing balance-Leahy Scale: Fair  Communication Communication Communication: No apparent difficulties  Cognition Arousal: Alert Behavior During Therapy: WFL for tasks assessed/performed   PT - Cognitive impairments: No apparent impairments                         Following commands: Intact      Cueing Cueing Techniques: Verbal cues  Exercises      General Comments        Pertinent Vitals/Pain Pain  Assessment Pain Assessment: 0-10 Pain Score: 2  Pain Location: back/flank Pain Descriptors / Indicators: Aching, Sore Pain Intervention(s): Limited activity within patient's tolerance, Monitored during session, Repositioned    Home Living                          Prior Function            PT Goals (current goals can now be found in the care plan section) Acute Rehab PT Goals Patient Stated Goal: REgain IND to care for disabled spouse PT Goal Formulation: With patient Time For Goal Achievement: 12/04/23 Potential to Achieve Goals: Good Progress towards PT goals: Progressing toward goals    Frequency    Min 3X/week      PT Plan      Co-evaluation              AM-PAC PT 6 Clicks Mobility   Outcome Measure  Help needed turning from your back to your side while in a flat bed without using bedrails?: A Lot Help needed moving from lying on your back to sitting on the side of a flat bed without using bedrails?: A Little Help needed moving to and from a bed to a chair (including a wheelchair)?: A Little Help needed standing up from a chair using your arms (e.g., wheelchair or bedside chair)?: A Little Help needed to walk in hospital room?: A Little Help needed climbing 3-5 steps with a railing? : A Lot 6 Click Score: 16    End of Session Equipment Utilized During Treatment: Gait belt Activity Tolerance: Patient tolerated treatment well Patient left: in bed;with call bell/phone within reach Nurse Communication: Mobility status PT Visit Diagnosis: Unsteadiness on feet (R26.81);Difficulty in walking, not elsewhere classified (R26.2)     Time: 8866-8841 PT Time Calculation (min) (ACUTE ONLY): 25 min  Charges:    $Gait Training: 8-22 mins $Therapeutic Activity: 8-22 mins PT General Charges $$ ACUTE PT VISIT: 1 Visit                     Jonathan Jimenez, PT Acute Rehab    Jonathan Jimenez Jonathan Jimenez 11/25/2023, 12:41 PM

## 2023-11-25 NOTE — Progress Notes (Signed)
   NAME:  Jonathan Jimenez, MRN:  [id number], DOB:  1945/09/20, LOS: 7 ADMISSION DATE:  11/18/2023, CONSULTATION DATE:  11/19/23  REFERRING MD:  Dr Briana, CHIEF COMPLAINT:  loculated pleural effusion   History of Present Illness:  Asked to see patient for a loculated pleural effusion  He had thoracentesis 6/19 430 cc of hazy yellow fluid  Came in with a couple of days history of chest discomfort.  Had a chest x-ray and a CT scan of the chest showing a moderately large loculated pleural effusion with underlying pneumonia   Never smoker Worked as a Music therapist  Pertinent  Medical History   diabetes, chronic kidney disease stage IIIa, hyperlipidemia   Significant Hospital Events: Including procedures, antibiotic start and stop dates in addition to other pertinent events   11/18/2023-CT scan of the chest showing loculated moderate effusion 6/19 lytics x 1 6/20 #2 6/21 #3  Interim History / Subjective:  750 in the last 24 hours out of Chest tube. Echo in progress. Good uop with lasix   Objective    Blood pressure 129/61, pulse 80, temperature 98 F (36.7 C), temperature source Oral, resp. rate 14, height 5' 9 (1.753 m), weight 102.1 kg, SpO2 95%.        Intake/Output Summary (Last 24 hours) at 11/25/2023 0910 Last data filed at 11/25/2023 0800 Gross per 24 hour  Intake 476.1 ml  Output 2500 ml  Net -2023.9 ml   Filed Weights   11/18/23 1450 11/18/23 2145  Weight: 102.1 kg 102.1 kg    Examination:  Elderly man, no respiratory distress, on room air Laying flat Breath sounds diminished right lung base, right chest tube in place  Breathing non labored RRR  Abdomen obese, soft  Bedside ultrasound right pleural space 6/24 shows atelectatic lung, no substantial fluid above the diaphragm   Na 134 K 3.5 Cr 1.15 WBC downtrended to 12.4 from 26 on admission  Urine strep antigen negative Body fluid culture shows rare strep  Resolved problem list   Assessment and Plan    Multilobar pneumonia of right lung Right empyema  Community-acquired pneumonia 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 No further abx needed. Strong suspicion the remainder of the fluid is related to heart failure. Will diurese again with IV lasix and likely d/c chest tube later today. Will await echo results   PCCM will follow  Verdon Gore, MD Pulmonary and Critical Care Medicine [facility] 11/25/2023 9:10 AM Pager: see AMION  If no response to pager, please call critical care on call (see AMION) until 7pm After 7:00 pm call Elink

## 2023-11-25 NOTE — Progress Notes (Signed)
  Progress Note   Patient: Jonathan Jimenez FMW:[id number] DOB: 1945-06-21 DOA: 11/18/2023     7 DOS: the patient was seen and examined on 11/25/2023   Brief hospital course: Frazier Balfour is a 78 y.o. male with a history of hypertension, CKD stage IIIa, diabetes mellitus type 2, hyperlipidemia.  Patient presented secondary to right sided back pain with associated shortness of breath and was found to have pneumonia with a right-sided loculated pleural effusion. Empiric antibiotics started. Pulmonology consulted for loculated effusion and consulted IR for right chest tube placement, which was successfully performed on 6/19.  Assessment and Plan: Community acquired pneumonia Present on admission. Blood cultures obtained on admission. Patient started empirically on ceftriaxone  and azithromycin . Blood cultures with no growth to date. Leukocytosis improving; no fevers. Pleural fluid culture growing streptococcus species. Azithromycin  discontinued. -Continued Ceftriaxone    Severe sepsis Present on admission. Secondary to pneumonia.   Right-sided loculated pleural effusion Right-sided empyema Likely secondary to pneumonia. IR consulted for thoracentesis but only yielded 130 mL of fluid; cultures and labs sent. Pulmonology consulted and consulted IR for right chest tube placement, which was performed on 6/19. Lytics x3 per pulmonology. -Follow-up pleural fluid labs (sensitivities pending) -Pulmonology recommendations: chest tube managemen -Suspicion for fluid related to heart failure. Echo reviewed. Normal LV EF with normal diastolic function. RV size and function normal   Acute respiratory failure with hypoxia SpO2 down to 87% on room air. Patient placed on nasal canula. Rate increased to 5 L/min for ongoing hypoxia. PT/OT recommending home health services. -cont to wean O2 as tolerated -Incentive spirometer -PT/OT   Primary hypertension Noted. Patient is on amlodipine, triamterene-hydrochlorothiazide and  Coreg  as an outpatient. All medication held on admission. -Continue Coreg    AKI on CKD stage IIIa Unknown baseline creatinine, however, Creatinine of 1.91 on admission likely above baseline as he has a history of only stage IIIa disease. Triamterene-hydrochlorothiazide held on admission.  -Cr improving   Diabetes mellitus, type 2 Well controlled based on hemoglobin A1C of 6.3%. Patient is managed on metformin as an outpatient.   Hyperlipidemia -Continue simvastatin    Hypokalemia Potassium supplementation given.      Subjective: Without complaints this AM  Physical Exam: Vitals:   11/24/23 1225 11/24/23 2149 11/25/23 0238 11/25/23 1221  BP: (!) 143/64 (!) 111/56 129/61 (!) 121/57  Pulse: 81 79 80 80  Resp: (!) 22 16 14  (!) 22  Temp: 99.2 F (37.3 C) 99.2 F (37.3 C) 98 F (36.7 C) 97.8 F (36.6 C)  TempSrc: Oral Oral Oral Oral  SpO2: 96% 93% 95% 96%  Weight:      Height:       General exam: Awake, laying in bed, in nad Respiratory system: Normal respiratory effort, no wheezing, chest tube in place Cardiovascular system: regular rate, s1, s2 Gastrointestinal system: Soft, nondistended, positive BS Central nervous system: CN2-12 grossly intact, strength intact Extremities: Perfused, no clubbing Skin: Normal skin turgor, no notable skin lesions seen Psychiatry: Mood normal // no visual hallucinations   Data Reviewed:  Labs reviewed: Na 134, K 3.5, Cr 1.15, WBC 12.4, Hgb 11.3, Plts 333  Family Communication: Pt in room, family not at bedside  Disposition: Status is: Inpatient Remains inpatient appropriate because: Severity of illness  Planned Discharge Destination: Home    Author: Garnette Pelt, MD 11/25/2023 5:08 PM  For on call review [email or web address].

## 2023-11-26 ENCOUNTER — Inpatient Hospital Stay (HOSPITAL_COMMUNITY)

## 2023-11-26 ENCOUNTER — Telehealth: Payer: Self-pay | Admitting: Internal Medicine

## 2023-11-26 DIAGNOSIS — J90 Pleural effusion, not elsewhere classified: Secondary | ICD-10-CM

## 2023-11-26 DIAGNOSIS — J9601 Acute respiratory failure with hypoxia: Secondary | ICD-10-CM | POA: Diagnosis not present

## 2023-11-26 DIAGNOSIS — J189 Pneumonia, unspecified organism: Secondary | ICD-10-CM | POA: Diagnosis not present

## 2023-11-26 LAB — COMPREHENSIVE METABOLIC PANEL WITH GFR
ALT: 56 U/L — ABNORMAL HIGH (ref 0–44)
AST: 60 U/L — ABNORMAL HIGH (ref 15–41)
Albumin: 1.8 g/dL — ABNORMAL LOW (ref 3.5–5.0)
Alkaline Phosphatase: 79 U/L (ref 38–126)
Anion gap: 9 (ref 5–15)
BUN: 18 mg/dL (ref 8–23)
CO2: 23 mmol/L (ref 22–32)
Calcium: 7.9 mg/dL — ABNORMAL LOW (ref 8.9–10.3)
Chloride: 104 mmol/L (ref 98–111)
Creatinine, Ser: 1.04 mg/dL (ref 0.61–1.24)
GFR, Estimated: >60 mL/min (ref >60)
Glucose, Bld: 129 mg/dL — ABNORMAL HIGH (ref 70–99)
Potassium: 3.7 mmol/L (ref 3.5–5.1)
Sodium: 136 mmol/L (ref 135–145)
Total Bilirubin: 0.7 mg/dL (ref 0.0–1.2)
Total Protein: 5.2 g/dL — ABNORMAL LOW (ref 6.5–8.1)

## 2023-11-26 LAB — CBC
HCT: 34.2 % — ABNORMAL LOW (ref 39.0–52.0)
Hemoglobin: 11.3 g/dL — ABNORMAL LOW (ref 13.0–17.0)
MCH: 30.5 pg (ref 26.0–34.0)
MCHC: 33 g/dL (ref 30.0–36.0)
MCV: 92.2 fL (ref 80.0–100.0)
Platelets: 355 10*3/uL (ref 150–400)
RBC: 3.71 MIL/uL — ABNORMAL LOW (ref 4.22–5.81)
RDW: 13.5 % (ref 11.5–15.5)
WBC: 11.1 10*3/uL — ABNORMAL HIGH (ref 4.0–10.5)
nRBC: 0 % (ref 0.0–0.2)

## 2023-11-26 LAB — GLUCOSE, CAPILLARY
Glucose-Capillary: 133 mg/dL — ABNORMAL HIGH (ref 70–99)
Glucose-Capillary: 161 mg/dL — ABNORMAL HIGH (ref 70–99)
Glucose-Capillary: 103 mg/dL — ABNORMAL HIGH (ref 70–99)

## 2023-11-26 NOTE — Progress Notes (Signed)
   NAME:  Jonathan Jimenez, MRN:  [id number], DOB:  1946/02/09, LOS: 8 ADMISSION DATE:  11/18/2023, CONSULTATION DATE:  11/19/23  REFERRING MD:  Dr Briana, CHIEF COMPLAINT:  loculated pleural effusion   History of Present Illness:  Asked to see patient for a loculated pleural effusion  He had thoracentesis 6/19 430 cc of hazy yellow fluid  Came in with a couple of days history of chest discomfort.  Had a chest x-ray and a CT scan of the chest showing a moderately large loculated pleural effusion with underlying pneumonia   Never smoker Worked as a Music therapist  Pertinent  Medical History   diabetes, chronic kidney disease stage IIIa, hyperlipidemia   Significant Hospital Events: Including procedures, antibiotic start and stop dates in addition to other pertinent events   11/18/2023-CT scan of the chest showing loculated moderate effusion 6/19 lytics x 1 6/20 #2 6/21 #3  Interim History / Subjective:  750 in the last 24 hours out of Chest tube. Echo in progress. Good uop with lasix   Objective    Blood pressure 128/60, pulse 76, temperature 99 F (37.2 C), temperature source Oral, resp. rate (!) 22, height 5' 9 (1.753 m), weight 102.1 kg, SpO2 95%.        Intake/Output Summary (Last 24 hours) at 11/26/2023 1251 Last data filed at 11/26/2023 1129 Gross per 24 hour  Intake 390 ml  Output 1920 ml  Net -1530 ml   Filed Weights   11/18/23 1450 11/18/23 2145  Weight: 102.1 kg 102.1 kg    Examination:  Elderly man, no respiratory distress on room air, laying flat Breath sounds diminished bilaterally, no wheeze RRR no mrg No peripheral edema  Bedside ultrasound right pleural space 6/24 shows atelectatic lung, no substantial fluid above the diaphragm   WBC downtrended  Urine strep antigen negative Body fluid culture shows rare strep  Resolved problem list   Assessment and Plan   Multilobar pneumonia of right lung Right empyema  Community-acquired pneumonia Suspected  acute on chronic hfpef - total 5 days of ceftriaxone  completed. No further abx needed. Strong suspicion the remainder of the fluid is related to heart failure, however echo shows preserved EF and no significant diastolic dysfunction. Chest tube output has dropped off, however, and he is clinically improved.   Ok to d/c chest tube.  Will set him up for outpatient follow up and chest xray beforehand.   No objection to d/c from pccm perspective. Son updated over the phone.   Verdon Gore, MD Pulmonary and Critical Care Medicine [facility] 11/26/2023 12:51 PM Pager: see AMION  If no response to pager, please call critical care on call (see AMION) until 7pm After 7:00 pm call Elink

## 2023-11-26 NOTE — Progress Notes (Signed)
 AVS reviewed w/ patient who verbalized an understanding. Pt was instructed to maintain the vaseline gauze x 2 days after chest tube was pulled- pt has supplies. PIV removed as noted. Phone with pt, charger & shaver placed in belonging bag. Pt's son is bringing clothes, should be here within the hour. No other questions at this time. Purewick in place until discharge- primary nurse Lonell updated in person.

## 2023-11-26 NOTE — Progress Notes (Signed)
 Report received from L.Nelson,RN. No change in previous assessment. Continue plan of care. Abrham Maslowski, Lonell Louder, RN

## 2023-11-26 NOTE — Progress Notes (Signed)
 Patient ID: Jonathan Jimenez, male   DOB: [birth year], 78 y.o.   MRN: [id number] Per order of CCM patient's right posterior chest drain was removed in its entirety without immediate complications.  Vaseline gauze dressing applied over site.  Will check follow-up chest x-ray in 1 hour.

## 2023-11-26 NOTE — Telephone Encounter (Signed)
 Needs hospital follow up for empyema with chest xray beforehand, any provider.

## 2023-11-26 NOTE — Discharge Summary (Signed)
 Physician Discharge Summary   Patient: Jonathan Jimenez MRN: [id number] DOB: [birth year]  Admit date:     11/18/2023  Discharge date: 11/26/23  Discharge Physician: Garnette Pelt   PCP: Larnell Hamilton, MD   Recommendations at discharge:    Follow up with PCP in 1-2 weeks  Discharge Diagnoses: Principal Problem:   Acute respiratory failure with hypoxia (HCC) Active Problems:   CAP (community acquired pneumonia)   Pleural effusion, right  Resolved Problems:   * No resolved hospital problems. *  Hospital Course: Jonathan Jimenez is a 78 y.o. male with a history of hypertension, CKD stage IIIa, diabetes mellitus type 2, hyperlipidemia.  Patient presented secondary to right sided back pain with associated shortness of breath and was found to have pneumonia with a right-sided loculated pleural effusion. Empiric antibiotics started. Pulmonology consulted for loculated effusion and consulted IR for right chest tube placement, which was successfully performed on 6/19.  Assessment and Plan: Community acquired pneumonia Present on admission. Blood cultures obtained on admission. Patient started empirically on ceftriaxone  and azithromycin . Blood cultures with no growth to date. Leukocytosis improving; no fevers. Pleural fluid culture growing streptococcus species. Azithromycin  discontinued. -completed Ceftriaxone . No further abx per Pulm   Severe sepsis Present on admission. Secondary to pneumonia.   Right-sided loculated pleural effusion Right-sided empyema Likely secondary to pneumonia. IR consulted for thoracentesis but only yielded 130 mL of fluid; cultures and labs sent. Pulmonology consulted and consulted IR for right chest tube placement, which was performed on 6/19. Lytics x3 per pulmonology. -Pulmonology recommended: chest tube. Good output with chest tube and with diuretic -Suspicion for fluid related to heart failure. Echo reviewed. Normal LV EF with normal diastolic function. RV size and  function normal   Acute respiratory failure with hypoxia SpO2 down to 87% on room air. Patient placed on nasal canula. Rate increased to 5 L/min for ongoing hypoxia. PT/OT recommending home health services. -weaned to RA -Incentive spirometer -PT/OT   Primary hypertension Noted. Patient is on amlodipine, triamterene-hydrochlorothiazide and Coreg  as an outpatient. All medication held on admission. -Continued Coreg . Would resume diuretic on d/c -gradually add back norvasc as tolerated   AKI on CKD stage IIIa Unknown baseline creatinine, however, Creatinine of 1.91 on admission likely above baseline as he has a history of only stage IIIa disease. Triamterene-hydrochlorothiazide held on admission.  -Cr improved   Diabetes mellitus, type 2 Well controlled based on hemoglobin A1C of 6.3%. Patient is managed on metformin as an outpatient.   Hyperlipidemia -Continue simvastatin    Hypokalemia Potassium supplementation given.       Consultants: Pulmonary Procedures performed: Chest tube  Disposition: Home Diet recommendation:  Regular diet DISCHARGE MEDICATION: Allergies as of 11/26/2023   No Known Allergies      Medication List     STOP taking these medications    amLODipine 10 MG tablet Commonly known as: NORVASC   doxycycline  100 MG capsule Commonly known as: VIBRAMYCIN        TAKE these medications    carvedilol  25 MG tablet Commonly known as: COREG  TAKE 1 TABLET BY MOUTH TWICE A DAY FOR HYPERTENSION   ICAPS Tabs Take 1 tablet by mouth in the morning and at bedtime.   metFORMIN 500 MG tablet Commonly known as: GLUCOPHAGE TAKE 1 TABLET WITH A MEAL BY MOUTH IN THE EVENING   multivitamin with minerals Tabs tablet Take 1 tablet by mouth daily.   NexIUM 20 MG capsule Generic drug: esomeprazole Take 20 mg by mouth daily as needed.  simvastatin  20 MG tablet Commonly known as: ZOCOR  Take 20 mg by mouth every evening.   tamsulosin 0.4 MG Caps  capsule Commonly known as: FLOMAX TAKE 1 CAPSULE BY MOUTH EVERYDAY AT BEDTIME   triamterene-hydrochlorothiazide 75-50 MG tablet Commonly known as: MAXZIDE Take 1 tablet by mouth daily.   Vitamin B Complex Tabs Take 1 tablet by mouth daily.               Durable Medical Equipment  (From admission, onward)           Start     Ordered   11/23/23 1221  For home use only DME Walker rolling  Once       Question Answer Comment  Walker: With 5 Inch Wheels   Patient needs a walker to treat with the following condition Unsteady gait      11/23/23 1221            Follow-up Information     [facility] Follow up.   Specialty: Home Health Services Why: [facility] Home physical therapy Contact information: [address] [place] [address] [phone]         [facility] Follow up.   Why: rolling walker Contact information: [address] [place] [phone]        [facility]. Schedule an appointment as soon as possible for a visit in 3 week(s).   Specialty: Pulmonology Why: Follow up for fluid around lung. will get chest xray in office before appointment. Contact information: [address] [place]  [phone] Additional information: [address]  [address]  [place], [place] [address]               Discharge Exam: [name]   11/18/23 1450 11/18/23 2145  Weight: 102.1 kg 102.1 kg   General exam: Awake, laying in bed, in nad Respiratory system: Normal respiratory effort, no wheezing Cardiovascular system: regular rate, s1, s2 Gastrointestinal system: Soft, nondistended, positive BS Central nervous system: CN2-12 grossly intact, strength intact Extremities: Perfused, no clubbing Skin: Normal skin turgor, no notable skin lesions seen Psychiatry: Mood normal // no visual hallucinations   Condition at discharge: fair  The results of  significant diagnostics from this hospitalization (including imaging, microbiology, ancillary and laboratory) are listed below for reference.   Imaging Studies: DG Chest Port 1 View Result Date: 11/26/2023 CLINICAL DATA:  Status post chest tube removal. EXAM: PORTABLE CHEST 1 VIEW COMPARISON:  Same day. FINDINGS: Right-sided chest tube has been removed. Stable appearance loculated right lateral pneumothorax compared to prior exam. Left lung is clear. IMPRESSION: Stable appearance of loculated right lateral pneumothorax status post chest tube removal. Electronically Signed   By: Lynwood Landy Raddle M.D.   On: 11/26/2023 17:26   DG CHEST PORT 1 VIEW Result Date: 11/26/2023 CLINICAL DATA:  Chest tube placement. EXAM: PORTABLE CHEST 1 VIEW COMPARISON:  11/24/2023 FINDINGS: Stable cardiomediastinal contours. Low lung volumes with asymmetric elevation of right hemidiaphragm. Unchanged position of right-sided chest tube overlying the right lower lung. The adjacent loculated pneumothorax is again noted and appears similar in volume to the previous exam. Visualized osseous structures are unremarkable. IMPRESSION: 1. Unchanged position of right-sided chest tube with unchanged appearance of adjacent loculated pneumothorax. 2. Decreased aeration to the right lung base is unchanged. Electronically Signed   By: Waddell Calk M.D.   On: 11/26/2023 08:54   ECHOCARDIOGRAM COMPLETE Result Date:  11/25/2023    ECHOCARDIOGRAM REPORT   Patient Name:   KYREESE CHIO Date of Exam: 11/25/2023 Medical Rec #:  [id number]  Height:       69.0 in Accession #:    [id number] Weight:       225.1 lb Date of Birth:  [birth year]  BSA:          2.172 m Patient Age:    77 years   BP:           129/61 mmHg Patient Gender: M          HR:           82 bpm. Exam Location:  Inpatient Procedure: 2D Echo, Color Doppler, Cardiac Doppler and Intracardiac            Opacification Agent (Both Spectral and Color Flow Doppler were            utilized during  procedure). Indications:    Dyspnea  History:        Patient has no prior history of Echocardiogram examinations.                 Risk Factors:Diabetes and Dyslipidemia.  Sonographer:    Therisa Crouch Referring Phys: [id number] NIKITA S DESAI IMPRESSIONS  1. Left ventricular ejection fraction, by estimation, is 60 to 65%. The left ventricle has normal function. The left ventricle has no regional wall motion abnormalities. There is mild concentric left ventricular hypertrophy. Left ventricular diastolic parameters were normal.  2. Right ventricular systolic function is normal. The right ventricular size is normal.  3. Left atrial size was mildly dilated.  4. The mitral valve is normal in structure. No evidence of mitral valve regurgitation. No evidence of mitral stenosis.  5. The aortic valve is normal in structure. Aortic valve regurgitation is not visualized. No aortic stenosis is present.  6. The inferior vena cava is normal in size with greater than 50% respiratory variability, suggesting right atrial pressure of 3 mmHg. FINDINGS  Left Ventricle: Left ventricular ejection fraction, by estimation, is 60 to 65%. The left ventricle has normal function. The left ventricle has no regional wall motion abnormalities. The left ventricular internal cavity size was normal in size. There is  mild concentric left ventricular hypertrophy. Left ventricular diastolic parameters were normal. Right Ventricle: The right ventricular size is normal. No increase in right ventricular wall thickness. Right ventricular systolic function is normal. Left Atrium: Left atrial size was mildly dilated. Right Atrium: Right atrial size was normal in size. Pericardium: There is no evidence of pericardial effusion. Mitral Valve: The mitral valve is normal in structure. No evidence of mitral valve regurgitation. No evidence of mitral valve stenosis. Tricuspid Valve: The tricuspid valve is normal in structure. Tricuspid valve regurgitation is not  demonstrated. No evidence of tricuspid stenosis. Aortic Valve: The aortic valve is normal in structure. Aortic valve regurgitation is not visualized. No aortic stenosis is present. Pulmonic Valve: The pulmonic valve was normal in structure. Pulmonic valve regurgitation is not visualized. No evidence of pulmonic stenosis. Aorta: The aortic root is normal in size and structure. Venous: The inferior vena cava is normal in size with greater than 50% respiratory variability, suggesting right atrial pressure of 3 mmHg. IAS/Shunts: No atrial level shunt detected by color flow Doppler.  LEFT VENTRICLE PLAX 2D LVIDd:         5.10 cm   Diastology LVIDs:         3.40 cm   LV e' medial:  8.16 cm/s LV PW:         1.30 cm   LV E/e' medial:  7.5 LV IVS:        1.20 cm   LV e' lateral:   14.30 cm/s LVOT diam:     2.10 cm   LV E/e' lateral: 4.3 LVOT Area:     3.46 cm  RIGHT VENTRICLE             IVC RV Basal diam:  3.40 cm     IVC diam: 1.60 cm RV S prime:     13.30 cm/s TAPSE (M-mode): 1.9 cm LEFT ATRIUM             Index LA diam:        4.40 cm 2.03 cm/m LA Vol (A2C):   54.2 ml 24.95 ml/m LA Vol (A4C):   87.0 ml 40.05 ml/m LA Biplane Vol: 69.3 ml 31.90 ml/m   AORTA Ao Root diam: 2.90 cm Ao Asc diam:  3.10 cm MITRAL VALVE MV Area (PHT): 3.37 cm    SHUNTS MV Decel Time: 225 msec    Systemic Diam: 2.10 cm MV E velocity: 60.80 cm/s MV A velocity: 76.70 cm/s MV E/A ratio:  0.79 Aditya Sabharwal Electronically signed by Ria Commander Signature Date/Time: 11/25/2023/4:46:50 PM    Final    CT CHEST WO CONTRAST Result Date: 11/24/2023 CLINICAL DATA:  Empyema, history of chest tube EXAM: CT CHEST WITHOUT CONTRAST TECHNIQUE: Multidetector CT imaging of the chest was performed following the standard protocol without IV contrast. RADIATION DOSE REDUCTION: This exam was performed according to the departmental dose-optimization program which includes automated exposure control, adjustment of the mA and/or kV according to patient  size and/or use of iterative reconstruction technique. COMPARISON:  11/24/2023, 11/18/2023 FINDINGS: Cardiovascular: Unenhanced imaging of the heart is unremarkable without pericardial effusion. Normal caliber of the thoracic aorta. Stable atherosclerosis of the aorta and coronary vasculature. Mediastinum/Nodes: Stable borderline enlarged mediastinal adenopathy, measuring up to 14 mm in the right paratracheal region. Multiple calcified hilar and subcarinal lymph nodes are also seen, consistent with history of granulomatous disease. Thyroid , trachea, and esophagus are stable. Lungs/Pleura: Right-sided pigtail pleural drainage catheter is identified, with marked decrease of the loculated right pleural effusion seen previously. There is a trace residual right-sided hydropneumothorax not identified, gas component far less than 5% and fluid component less than 100 cc. Continued dense consolidation within the right lower lobe, with punctate radiodensities dependent within the right lung possibly reflecting aspirated barium. Trace left pleural effusion is identified, volume estimated less than 100 cc. Stable punctate radiodensities within the dependent left lower lobe also likely reflecting aspirated barium. No left airspace disease or left-sided pneumothorax. Central airways are patent. Upper Abdomen: Cholelithiasis without acute cholecystitis. Musculoskeletal: No acute or destructive bony abnormalities. Reconstructed images demonstrate no additional findings. IMPRESSION: 1. Pigtail drainage catheter within the right pleural space, with trace residual right-sided hydropneumothorax as described above. 2. Trace left pleural effusion. 3. Persistent right lower lobe consolidation compatible with atelectasis or residual airspace disease. 4. Stable mediastinal adenopathy. Electronically Signed   By: Ozell Daring M.D.   On: 11/24/2023 14:55   DG Chest Port 1 View Result Date: 11/24/2023 CLINICAL DATA:  Chest tube EXAM:  PORTABLE CHEST 1 VIEW COMPARISON:  X-ray 11/23/2023 and older FINDINGS: Poor inflation. Stable cardiopericardial silhouette with bronchovascular crowding. Right basilar pigtail catheter seen. There is a small potential loculated pneumothorax just lateral to the pigtail catheter, better seen on the current x-ray. Slightly  increased opacity at the medial right lung base. No left-sided pneumothorax. Overlapping cardiac leads. Degenerative changes. IMPRESSION: Increasing right lung base opacity.  Poor inflation. Right basilar pigtail catheter seen. There is a lucency along the lateral lower right hemithorax. A small loculated pneumothorax is possible in this location. This is better seen on the current examination. Electronically Signed   By: Ranell Bring M.D.   On: 11/24/2023 10:54   DG CHEST PORT 1 VIEW Result Date: 11/23/2023 CLINICAL DATA:  Acute respiratory failure with hypoxia EXAM: PORTABLE CHEST 1 VIEW COMPARISON:  11/21/2023 FINDINGS: Right-sided pleural pigtail catheter is similar in position. Midline trachea. Cardiomegaly accentuated by AP portable technique. Trace right pleural fluid or thickening blunts the costophrenic angle. No pneumothorax. Right mid and lower lung airspace disease is minimally improved. IMPRESSION: Right-sided pigtail pleural catheter remaining in place, without pneumothorax. Slight improvement in right lung aeration. Electronically Signed   By: Rockey Kilts M.D.   On: 11/23/2023 08:34   DG CHEST PORT 1 VIEW Result Date: 11/21/2023 CLINICAL DATA:  Empyema.  Chest tube placement EXAM: PORTABLE CHEST 1 VIEW COMPARISON:  11/20/2018 FINDINGS: Marked reduction in pleural fluid in the RIGHT hemithorax following chest tube placement. Chest tube coiled in the RIGHT lung base. Persistent RIGHT basilar atelectasis. No pneumothorax. LEFT lung remains relatively clear. IMPRESSION: Marked reduction in RIGHT pleural fluid following chest tube placement. Electronically Signed   By: Jackquline Boxer M.D.   On: 11/21/2023 10:40   DG Chest Port 1 View Result Date: 11/20/2023 CLINICAL DATA:  Right-sided pleural effusion and chest tube EXAM: PORTABLE CHEST 1 VIEW COMPARISON:  Yesterday FINDINGS: Right pleural pigtail catheter is been placed in the interval. Patient rotated to the left. The Chin overlies the upper chest. Moderate cardiomegaly. Small to moderate right pleural effusion, decreased. No pneumothorax. Mild interstitial edema. Relatively similar right sided airspace disease, relatively diffuse with sparing of the right apex. IMPRESSION: Placement of a right-sided pleural pigtail catheter with decrease in right-sided pleural fluid but persistent relatively diffuse right-sided airspace disease. Cardiomegaly with mild pulmonary venous congestion. Electronically Signed   By: Rockey Kilts M.D.   On: 11/20/2023 10:09   CT [facility] PLEURAL DRAIN W/INDWELL CATH W/IMG GUIDE Result Date: 11/20/2023 INDICATION: Right-sided empyema planned placement of a right-sided pleural drain for therapy. EXAM: CT-guided chest tube placement TECHNIQUE: Multidetector CT imaging of the chest was performed following the standard protocol without IV contrast. RADIATION DOSE REDUCTION: This exam was performed according to the departmental dose-optimization program which includes automated exposure control, adjustment of the mA and/or kV according to patient size and/or use of iterative reconstruction technique. MEDICATIONS: The patient is currently admitted to the hospital and receiving intravenous antibiotics. The antibiotics were administered within an appropriate time frame prior to the initiation of the procedure. ANESTHESIA/SEDATION: Moderate (conscious) sedation was employed during this procedure. A total of Versed  2 mg and Fentanyl  100 mcg was administered intravenously by the radiology nurse. Total intra-service moderate Sedation Time: 16 minutes. The patient's level of consciousness and vital signs were monitored  continuously by radiology nursing throughout the procedure under my direct supervision. COMPLICATIONS: None immediate. PROCEDURE: Informed written consent was obtained from the patient after a thorough discussion of the procedural risks, benefits and alternatives. All questions were addressed. Maximal Sterile Barrier Technique was utilized including caps, mask, sterile gowns, sterile gloves, sterile drape, hand hygiene and skin antiseptic. A timeout was performed prior to the initiation of the procedure. The patient was placed in left lateral decubitus position  on the CT gantry. Initial axial images of the chest were obtained to delineate the areas of the empyema and targeting. Radiopaque markers were then placed on the patient's chest and further imaging was performed. The patient was then marked, prepped, and draped in the usual sterile fashion. Local anesthesia was achieved with 1% lidocaine . A stab incision was then made in the right axillary region and a 10 cm Yueh needle was advanced from the incision to the pleural space with intermittent axial imaging of the chest verified needle position. Once in the pleural cavity, the needle was removed leaving the cannula in position. A short Amplatz wire was then advanced into the pleural space. Access was then exchanged over the guidewire for a new 10 French pigtail catheter. The catheter was advanced into the pleural space. The stiffener was removed, the locking color engaged, and fluid was withdrawn. The catheter was then connected to suction. Retention suture and sterile dressing applied. IMPRESSION: Satisfactory place mint of a 10 French pigtail drainage catheter in the right pleural space. Electronically Signed   By: Cordella Banner   On: 11/20/2023 09:54   US  THORACENTESIS ASP PLEURAL SPACE W/IMG GUIDE Result Date: 11/19/2023 INDICATION: Patient with history of right-sided back pain, dyspnea, pneumonia, complex/ loculated right pleural effusion; request  received for diagnostic and therapeutic right thoracentesis EXAM: ULTRASOUND GUIDED DIAGNOSTIC AND THERAPEUTIC RIGHT THORACENTESIS MEDICATIONS: 8 mL 1% lidocaine  COMPLICATIONS: None immediate. PROCEDURE: An ultrasound guided thoracentesis was thoroughly discussed with the patient and questions answered. The benefits, risks, alternatives and complications were also discussed. The patient understands and wishes to proceed with the procedure. Written consent was obtained. Ultrasound was performed to localize and mark an adequate pocket of fluid in the right chest. The area was then prepped and draped in the normal sterile fashion. 1% Lidocaine  was used for local anesthesia. Under ultrasound guidance a 6 Fr Safe-T-Centesis catheter was introduced. Thoracentesis was performed. The catheter was removed and a dressing applied. FINDINGS: A total of approximately 130 cc of hazy, yellow fluid was removed. Samples were sent to the laboratory as requested by the clinical team. Despite catheter manipulation only the above amount of fluid could be aspirated due to the complex multiloculated nature of the pleural collection. IMPRESSION: Successful ultrasound guided diagnostic and therapeutic right thoracentesis yielding 130 cc of pleural fluid. Performed by: Franky Rakers, PA-C Electronically Signed   By: Cordella Banner   On: 11/19/2023 14:17   DG Chest Port 1 View Result Date: 11/19/2023 CLINICAL DATA:  Status post thoracentesis. EXAM: PORTABLE CHEST 1 VIEW COMPARISON:  X-ray 11/18/2023 FINDINGS: Underinflation. Persistent large right effusion with adjacent opacity. No pneumothorax. Persistent linear opacity left lung base likely scar or atelectasis. Enlarged cardiopericardial silhouette. Vascular congestion. Overlapping cardiac leads. IMPRESSION: No pneumothorax seen post thoracentesis. Electronically Signed   By: Ranell Bring M.D.   On: 11/19/2023 11:32   CT Chest Wo Contrast Result Date: 11/18/2023 CLINICAL DATA:   Abnormal chest x-ray with back pain short of breath EXAM: CT CHEST WITHOUT CONTRAST TECHNIQUE: Multidetector CT imaging of the chest was performed following the standard protocol without IV contrast. RADIATION DOSE REDUCTION: This exam was performed according to the departmental dose-optimization program which includes automated exposure control, adjustment of the mA and/or kV according to patient size and/or use of iterative reconstruction technique. COMPARISON:  Chest x-ray 11/18/2023 FINDINGS: Cardiovascular: Limited evaluation without intravenous contrast. Moderate aortic atherosclerosis. Nonaneurysmal aorta. Borderline cardiomegaly. Trace pericardial effusion. Coronary vascular calcifications. Mediastinum/Nodes: Patent trachea. No suspicious thyroid  mass.  Calcified mediastinal and hilar nodes consistent with prior granulomatous disease. Mildly enlarged paratracheal nodes measuring up to 14 mm. Enlarged subcarinal nodes measuring up to 18 mm. Esophagus within normal limits. Lungs/Pleura: Moderate loculated right pleural effusion. Consolidation within the right middle and lower lobes with air bronchograms. Punctate calcific densities within the consolidated right lower lobe. Similar punctate calcifications in the left lung base. Bilateral bronchial wall thickening. Upper Abdomen: Gallstones.  No acute finding Musculoskeletal: No acute osseous abnormality. Multilevel degenerative change IMPRESSION: 1. Moderate loculated right pleural effusion. Consolidation within the right middle and lower lobes with air bronchograms, findings are suspicious for pneumonia. Imaging follow-up to resolution is recommended to exclude underlying mass. Punctate calcific densities in the lower lobes, could reflect small granulomas or sequela of previous aspiration. 2. Mildly enlarged mediastinal lymph nodes, likely reactive. 3. Gallstones. 4. Aortic atherosclerosis. Aortic Atherosclerosis (ICD10-I70.0). Electronically Signed   By: Luke Bun M.D.   On: 11/18/2023 17:58   DG Chest 2 View Result Date: 11/18/2023 CLINICAL DATA:  Back pain and shortness of breath. EXAM: CHEST - 2 VIEW COMPARISON:  None Available. FINDINGS: The cardiac silhouette is mildly enlarged. Low lung volumes are noted with mild to moderate severity right perihilar and marked severity right basilar atelectasis and/or infiltrate. Mild to moderate severity left basilar scarring, atelectasis and/or infiltrate is also seen. There is a large right pleural effusion with a large loculated component suspected along the periphery of the mid right lung. No pneumothorax is identified. The visualized skeletal structures are unremarkable. IMPRESSION: 1. Large right pleural effusion with a large loculated component suspected along the periphery of the mid right lung. Correlation with chest CT is recommended. 2. Right perihilar and bibasilar atelectasis and/or infiltrate. Further evaluation with chest CT is recommended, as sequelae associated with an underlying neoplastic process cannot be excluded. Electronically Signed   By: Suzen Dials M.D.   On: 11/18/2023 16:40    Microbiology: Results for orders placed or performed during the hospital encounter of 11/18/23  Blood culture (routine x 2)     Status: None   Collection Time: 11/18/23  3:59 PM   Specimen: BLOOD  Result Value Ref Range Status   Specimen Description   Final    BLOOD LEFT ANTECUBITAL Performed at [facility], [address], [place], [place] [address]    Special Requests   Final    Blood Culture adequate volume BOTTLES DRAWN AEROBIC AND ANAEROBIC Performed at [facility], [address] [place], [place] [address]    Culture   Final    NO GROWTH 5 DAYS Performed at [facility], [address], [place], [place] [address]    Report Status 11/23/2023 FINAL  Final  Blood culture (routine x 2)     Status: None   Collection Time: 11/18/23  4:04 PM    Specimen: BLOOD  Result Value Ref Range Status   Specimen Description   Final    BLOOD RIGHT ANTECUBITAL Performed at [facility], [address], [place], [place] [address]    Special Requests   Final    Blood Culture results may not be optimal due to an inadequate volume of blood received in culture bottles BOTTLES DRAWN AEROBIC AND ANAEROBIC Performed at [facility], [address] [place], [place] [address]    Culture   Final    NO GROWTH 5 DAYS Performed at [facility], [address] [place], [place] [address]    Report  Status 11/23/2023 FINAL  Final  Body fluid culture w Gram Stain     Status: None (Preliminary result)   Collection Time: 11/19/23 11:08 AM   Specimen: Pleura; Body Fluid  Result Value Ref Range Status   Specimen Description   Final    PLEURAL Performed at [facility], [address], [place], [place] [address]    Special Requests   Final    RIGHT Performed at [facility], [address] [place], [place] [address]    Gram Stain   Final    RARE WBC PRESENT, PREDOMINANTLY PMN NO ORGANISMS SEEN    Culture   Final    RARE STREPTOCOCCUS INTERMEDIUS CRITICAL RESULT CALLED TO, READ BACK BY AND VERIFIED WITH: RN NATE CAUSBY [id number] AT 1004 BY EC CULTURE REINCUBATED FOR BETTER GROWTH Performed at [facility], [address] [place], [place] [address]    Report Status PENDING  Incomplete    Labs: CBC: Recent Labs  Lab 11/22/23 0547 11/23/23 0505 11/24/23 0501 11/25/23 0506 11/26/23 0457  WBC 15.3* 17.0* 14.0* 12.4* 11.1*  HGB 12.6* 11.9* 11.7* 11.3* 11.3*  HCT 38.3* 37.2* 35.7* 34.8* 34.2*  MCV 93.6 93.0 95.2 93.5 92.2  PLT 333 324 320 333 355   Basic Metabolic Panel: Recent Labs  Lab 11/22/23 0547 11/23/23 0505 11/24/23 0501 11/25/23 0506 11/26/23 0457  NA 135 137 135 134* 136  K 3.4* 3.4* 3.3* 3.5 3.7  CL 103 104 106 105 104  CO2 21* 23  21* 21* 23  GLUCOSE 151* 137* 140* 131* 129*  BUN 33* 33* <5* 23 18  CREATININE 1.20 1.37* 1.01 1.15 1.04  CALCIUM 8.0* 7.9* 7.5* 7.5* 7.9*   Liver Function Tests: Recent Labs  Lab 11/26/23 0457  AST 60*  ALT 56*  ALKPHOS 79  BILITOT 0.7  PROT 5.2*  ALBUMIN 1.8*   CBG: Recent Labs  Lab 11/25/23 1125 11/25/23 1610 11/26/23 0753 11/26/23 1120 11/26/23 1624  GLUCAP 162* 132* 133* 161* 103*    Discharge time spent: less than 30 minutes.  Signed: Garnette Pelt, MD [facility] 11/26/2023

## 2023-11-26 NOTE — Plan of Care (Signed)
  Problem: Education: Goal: Knowledge of General Education information will improve Description: Including pain rating scale, medication(s)/side effects and non-pharmacologic comfort measures Outcome: Progressing   Problem: Clinical Measurements: Goal: Diagnostic test results will improve Outcome: Progressing Goal: Respiratory complications will improve Outcome: Progressing Goal: Cardiovascular complication will be avoided Outcome: Progressing   Problem: Activity: Goal: Risk for activity intolerance will decrease Outcome: Progressing   Problem: Nutrition: Goal: Adequate nutrition will be maintained Outcome: Progressing   Problem: Coping: Goal: Level of anxiety will decrease Outcome: Progressing   Problem: Pain Managment: Goal: General experience of comfort will improve and/or be controlled Outcome: Progressing   Problem: Activity: Goal: Ability to tolerate increased activity will improve Outcome: Progressing

## 2023-11-26 NOTE — Telephone Encounter (Signed)
 Patient has been scheduled

## 2023-11-29 DIAGNOSIS — K219 Gastro-esophageal reflux disease without esophagitis: Secondary | ICD-10-CM | POA: Diagnosis not present

## 2023-11-29 DIAGNOSIS — N1831 Chronic kidney disease, stage 3a: Secondary | ICD-10-CM | POA: Diagnosis not present

## 2023-11-29 DIAGNOSIS — J189 Pneumonia, unspecified organism: Secondary | ICD-10-CM | POA: Diagnosis not present

## 2023-11-29 DIAGNOSIS — E876 Hypokalemia: Secondary | ICD-10-CM | POA: Diagnosis not present

## 2023-11-29 DIAGNOSIS — E1122 Type 2 diabetes mellitus with diabetic chronic kidney disease: Secondary | ICD-10-CM | POA: Diagnosis not present

## 2023-11-29 DIAGNOSIS — Z9181 History of falling: Secondary | ICD-10-CM | POA: Diagnosis not present

## 2023-11-29 DIAGNOSIS — I129 Hypertensive chronic kidney disease with stage 1 through stage 4 chronic kidney disease, or unspecified chronic kidney disease: Secondary | ICD-10-CM | POA: Diagnosis not present

## 2023-11-29 DIAGNOSIS — J90 Pleural effusion, not elsewhere classified: Secondary | ICD-10-CM | POA: Diagnosis not present

## 2023-11-29 DIAGNOSIS — Z7984 Long term (current) use of oral hypoglycemic drugs: Secondary | ICD-10-CM | POA: Diagnosis not present

## 2023-11-29 DIAGNOSIS — A419 Sepsis, unspecified organism: Secondary | ICD-10-CM | POA: Diagnosis not present

## 2023-11-29 DIAGNOSIS — M170 Bilateral primary osteoarthritis of knee: Secondary | ICD-10-CM | POA: Diagnosis not present

## 2023-11-29 DIAGNOSIS — E785 Hyperlipidemia, unspecified: Secondary | ICD-10-CM | POA: Diagnosis not present

## 2023-11-29 DIAGNOSIS — J9601 Acute respiratory failure with hypoxia: Secondary | ICD-10-CM | POA: Diagnosis not present

## 2023-12-01 DIAGNOSIS — E1169 Type 2 diabetes mellitus with other specified complication: Secondary | ICD-10-CM | POA: Diagnosis not present

## 2023-12-01 DIAGNOSIS — I10 Essential (primary) hypertension: Secondary | ICD-10-CM | POA: Diagnosis not present

## 2023-12-01 DIAGNOSIS — R82998 Other abnormal findings in urine: Secondary | ICD-10-CM | POA: Diagnosis not present

## 2023-12-01 DIAGNOSIS — N1831 Chronic kidney disease, stage 3a: Secondary | ICD-10-CM | POA: Diagnosis not present

## 2023-12-01 DIAGNOSIS — J189 Pneumonia, unspecified organism: Secondary | ICD-10-CM | POA: Diagnosis not present

## 2023-12-02 LAB — SUSCEPTIBILITY, AER + ANAEROB

## 2023-12-02 LAB — BODY FLUID CULTURE W GRAM STAIN

## 2023-12-02 LAB — SUSCEPTIBILITY RESULT

## 2023-12-04 DIAGNOSIS — J189 Pneumonia, unspecified organism: Secondary | ICD-10-CM | POA: Diagnosis not present

## 2023-12-04 DIAGNOSIS — Z7984 Long term (current) use of oral hypoglycemic drugs: Secondary | ICD-10-CM | POA: Diagnosis not present

## 2023-12-04 DIAGNOSIS — A419 Sepsis, unspecified organism: Secondary | ICD-10-CM | POA: Diagnosis not present

## 2023-12-04 DIAGNOSIS — M170 Bilateral primary osteoarthritis of knee: Secondary | ICD-10-CM | POA: Diagnosis not present

## 2023-12-04 DIAGNOSIS — E876 Hypokalemia: Secondary | ICD-10-CM | POA: Diagnosis not present

## 2023-12-04 DIAGNOSIS — E785 Hyperlipidemia, unspecified: Secondary | ICD-10-CM | POA: Diagnosis not present

## 2023-12-04 DIAGNOSIS — E1122 Type 2 diabetes mellitus with diabetic chronic kidney disease: Secondary | ICD-10-CM | POA: Diagnosis not present

## 2023-12-04 DIAGNOSIS — K219 Gastro-esophageal reflux disease without esophagitis: Secondary | ICD-10-CM | POA: Diagnosis not present

## 2023-12-04 DIAGNOSIS — N1831 Chronic kidney disease, stage 3a: Secondary | ICD-10-CM | POA: Diagnosis not present

## 2023-12-04 DIAGNOSIS — I129 Hypertensive chronic kidney disease with stage 1 through stage 4 chronic kidney disease, or unspecified chronic kidney disease: Secondary | ICD-10-CM | POA: Diagnosis not present

## 2023-12-04 DIAGNOSIS — J9601 Acute respiratory failure with hypoxia: Secondary | ICD-10-CM | POA: Diagnosis not present

## 2023-12-04 DIAGNOSIS — Z9181 History of falling: Secondary | ICD-10-CM | POA: Diagnosis not present

## 2023-12-04 DIAGNOSIS — J90 Pleural effusion, not elsewhere classified: Secondary | ICD-10-CM | POA: Diagnosis not present

## 2023-12-08 DIAGNOSIS — I129 Hypertensive chronic kidney disease with stage 1 through stage 4 chronic kidney disease, or unspecified chronic kidney disease: Secondary | ICD-10-CM | POA: Diagnosis not present

## 2023-12-08 DIAGNOSIS — Z1331 Encounter for screening for depression: Secondary | ICD-10-CM | POA: Diagnosis not present

## 2023-12-08 DIAGNOSIS — Z0000 Encounter for general adult medical examination without abnormal findings: Secondary | ICD-10-CM | POA: Diagnosis not present

## 2023-12-08 DIAGNOSIS — Z1339 Encounter for screening examination for other mental health and behavioral disorders: Secondary | ICD-10-CM | POA: Diagnosis not present

## 2023-12-08 DIAGNOSIS — J90 Pleural effusion, not elsewhere classified: Secondary | ICD-10-CM | POA: Diagnosis not present

## 2023-12-09 DIAGNOSIS — E785 Hyperlipidemia, unspecified: Secondary | ICD-10-CM | POA: Diagnosis not present

## 2023-12-09 DIAGNOSIS — E1122 Type 2 diabetes mellitus with diabetic chronic kidney disease: Secondary | ICD-10-CM | POA: Diagnosis not present

## 2023-12-09 DIAGNOSIS — J189 Pneumonia, unspecified organism: Secondary | ICD-10-CM | POA: Diagnosis not present

## 2023-12-09 DIAGNOSIS — K219 Gastro-esophageal reflux disease without esophagitis: Secondary | ICD-10-CM | POA: Diagnosis not present

## 2023-12-09 DIAGNOSIS — E876 Hypokalemia: Secondary | ICD-10-CM | POA: Diagnosis not present

## 2023-12-09 DIAGNOSIS — I129 Hypertensive chronic kidney disease with stage 1 through stage 4 chronic kidney disease, or unspecified chronic kidney disease: Secondary | ICD-10-CM | POA: Diagnosis not present

## 2023-12-09 DIAGNOSIS — J9601 Acute respiratory failure with hypoxia: Secondary | ICD-10-CM | POA: Diagnosis not present

## 2023-12-09 DIAGNOSIS — J90 Pleural effusion, not elsewhere classified: Secondary | ICD-10-CM | POA: Diagnosis not present

## 2023-12-09 DIAGNOSIS — Z9181 History of falling: Secondary | ICD-10-CM | POA: Diagnosis not present

## 2023-12-09 DIAGNOSIS — N1831 Chronic kidney disease, stage 3a: Secondary | ICD-10-CM | POA: Diagnosis not present

## 2023-12-09 DIAGNOSIS — M170 Bilateral primary osteoarthritis of knee: Secondary | ICD-10-CM | POA: Diagnosis not present

## 2023-12-09 DIAGNOSIS — A419 Sepsis, unspecified organism: Secondary | ICD-10-CM | POA: Diagnosis not present

## 2023-12-09 DIAGNOSIS — Z7984 Long term (current) use of oral hypoglycemic drugs: Secondary | ICD-10-CM | POA: Diagnosis not present

## 2023-12-11 DIAGNOSIS — E876 Hypokalemia: Secondary | ICD-10-CM | POA: Diagnosis not present

## 2023-12-11 DIAGNOSIS — K219 Gastro-esophageal reflux disease without esophagitis: Secondary | ICD-10-CM | POA: Diagnosis not present

## 2023-12-11 DIAGNOSIS — M170 Bilateral primary osteoarthritis of knee: Secondary | ICD-10-CM | POA: Diagnosis not present

## 2023-12-11 DIAGNOSIS — J9601 Acute respiratory failure with hypoxia: Secondary | ICD-10-CM | POA: Diagnosis not present

## 2023-12-11 DIAGNOSIS — I129 Hypertensive chronic kidney disease with stage 1 through stage 4 chronic kidney disease, or unspecified chronic kidney disease: Secondary | ICD-10-CM | POA: Diagnosis not present

## 2023-12-11 DIAGNOSIS — E785 Hyperlipidemia, unspecified: Secondary | ICD-10-CM | POA: Diagnosis not present

## 2023-12-11 DIAGNOSIS — Z7984 Long term (current) use of oral hypoglycemic drugs: Secondary | ICD-10-CM | POA: Diagnosis not present

## 2023-12-11 DIAGNOSIS — A419 Sepsis, unspecified organism: Secondary | ICD-10-CM | POA: Diagnosis not present

## 2023-12-11 DIAGNOSIS — N1831 Chronic kidney disease, stage 3a: Secondary | ICD-10-CM | POA: Diagnosis not present

## 2023-12-11 DIAGNOSIS — E1122 Type 2 diabetes mellitus with diabetic chronic kidney disease: Secondary | ICD-10-CM | POA: Diagnosis not present

## 2023-12-11 DIAGNOSIS — J90 Pleural effusion, not elsewhere classified: Secondary | ICD-10-CM | POA: Diagnosis not present

## 2023-12-11 DIAGNOSIS — J189 Pneumonia, unspecified organism: Secondary | ICD-10-CM | POA: Diagnosis not present

## 2023-12-11 DIAGNOSIS — Z9181 History of falling: Secondary | ICD-10-CM | POA: Diagnosis not present

## 2023-12-15 DIAGNOSIS — E785 Hyperlipidemia, unspecified: Secondary | ICD-10-CM | POA: Diagnosis not present

## 2023-12-15 DIAGNOSIS — J9601 Acute respiratory failure with hypoxia: Secondary | ICD-10-CM | POA: Diagnosis not present

## 2023-12-15 DIAGNOSIS — M170 Bilateral primary osteoarthritis of knee: Secondary | ICD-10-CM | POA: Diagnosis not present

## 2023-12-15 DIAGNOSIS — K219 Gastro-esophageal reflux disease without esophagitis: Secondary | ICD-10-CM | POA: Diagnosis not present

## 2023-12-15 DIAGNOSIS — E876 Hypokalemia: Secondary | ICD-10-CM | POA: Diagnosis not present

## 2023-12-15 DIAGNOSIS — N1831 Chronic kidney disease, stage 3a: Secondary | ICD-10-CM | POA: Diagnosis not present

## 2023-12-15 DIAGNOSIS — J90 Pleural effusion, not elsewhere classified: Secondary | ICD-10-CM | POA: Diagnosis not present

## 2023-12-15 DIAGNOSIS — Z7984 Long term (current) use of oral hypoglycemic drugs: Secondary | ICD-10-CM | POA: Diagnosis not present

## 2023-12-15 DIAGNOSIS — A419 Sepsis, unspecified organism: Secondary | ICD-10-CM | POA: Diagnosis not present

## 2023-12-15 DIAGNOSIS — J189 Pneumonia, unspecified organism: Secondary | ICD-10-CM | POA: Diagnosis not present

## 2023-12-15 DIAGNOSIS — I129 Hypertensive chronic kidney disease with stage 1 through stage 4 chronic kidney disease, or unspecified chronic kidney disease: Secondary | ICD-10-CM | POA: Diagnosis not present

## 2023-12-15 DIAGNOSIS — Z9181 History of falling: Secondary | ICD-10-CM | POA: Diagnosis not present

## 2023-12-15 DIAGNOSIS — E1122 Type 2 diabetes mellitus with diabetic chronic kidney disease: Secondary | ICD-10-CM | POA: Diagnosis not present

## 2023-12-17 DIAGNOSIS — A419 Sepsis, unspecified organism: Secondary | ICD-10-CM | POA: Diagnosis not present

## 2023-12-17 DIAGNOSIS — J9601 Acute respiratory failure with hypoxia: Secondary | ICD-10-CM | POA: Diagnosis not present

## 2023-12-17 DIAGNOSIS — E785 Hyperlipidemia, unspecified: Secondary | ICD-10-CM | POA: Diagnosis not present

## 2023-12-17 DIAGNOSIS — N1831 Chronic kidney disease, stage 3a: Secondary | ICD-10-CM | POA: Diagnosis not present

## 2023-12-17 DIAGNOSIS — Z7984 Long term (current) use of oral hypoglycemic drugs: Secondary | ICD-10-CM | POA: Diagnosis not present

## 2023-12-17 DIAGNOSIS — K219 Gastro-esophageal reflux disease without esophagitis: Secondary | ICD-10-CM | POA: Diagnosis not present

## 2023-12-17 DIAGNOSIS — E1122 Type 2 diabetes mellitus with diabetic chronic kidney disease: Secondary | ICD-10-CM | POA: Diagnosis not present

## 2023-12-17 DIAGNOSIS — I129 Hypertensive chronic kidney disease with stage 1 through stage 4 chronic kidney disease, or unspecified chronic kidney disease: Secondary | ICD-10-CM | POA: Diagnosis not present

## 2023-12-17 DIAGNOSIS — E876 Hypokalemia: Secondary | ICD-10-CM | POA: Diagnosis not present

## 2023-12-17 DIAGNOSIS — M170 Bilateral primary osteoarthritis of knee: Secondary | ICD-10-CM | POA: Diagnosis not present

## 2023-12-17 DIAGNOSIS — J189 Pneumonia, unspecified organism: Secondary | ICD-10-CM | POA: Diagnosis not present

## 2023-12-17 DIAGNOSIS — Z9181 History of falling: Secondary | ICD-10-CM | POA: Diagnosis not present

## 2023-12-17 DIAGNOSIS — J90 Pleural effusion, not elsewhere classified: Secondary | ICD-10-CM | POA: Diagnosis not present

## 2023-12-24 DIAGNOSIS — M170 Bilateral primary osteoarthritis of knee: Secondary | ICD-10-CM | POA: Diagnosis not present

## 2023-12-24 DIAGNOSIS — J189 Pneumonia, unspecified organism: Secondary | ICD-10-CM | POA: Diagnosis not present

## 2023-12-24 DIAGNOSIS — E785 Hyperlipidemia, unspecified: Secondary | ICD-10-CM | POA: Diagnosis not present

## 2023-12-24 DIAGNOSIS — J9601 Acute respiratory failure with hypoxia: Secondary | ICD-10-CM | POA: Diagnosis not present

## 2023-12-24 DIAGNOSIS — N1831 Chronic kidney disease, stage 3a: Secondary | ICD-10-CM | POA: Diagnosis not present

## 2023-12-24 DIAGNOSIS — J90 Pleural effusion, not elsewhere classified: Secondary | ICD-10-CM | POA: Diagnosis not present

## 2023-12-24 DIAGNOSIS — Z9181 History of falling: Secondary | ICD-10-CM | POA: Diagnosis not present

## 2023-12-24 DIAGNOSIS — E876 Hypokalemia: Secondary | ICD-10-CM | POA: Diagnosis not present

## 2023-12-24 DIAGNOSIS — Z7984 Long term (current) use of oral hypoglycemic drugs: Secondary | ICD-10-CM | POA: Diagnosis not present

## 2023-12-24 DIAGNOSIS — A419 Sepsis, unspecified organism: Secondary | ICD-10-CM | POA: Diagnosis not present

## 2023-12-24 DIAGNOSIS — K219 Gastro-esophageal reflux disease without esophagitis: Secondary | ICD-10-CM | POA: Diagnosis not present

## 2023-12-24 DIAGNOSIS — E1122 Type 2 diabetes mellitus with diabetic chronic kidney disease: Secondary | ICD-10-CM | POA: Diagnosis not present

## 2023-12-24 DIAGNOSIS — I129 Hypertensive chronic kidney disease with stage 1 through stage 4 chronic kidney disease, or unspecified chronic kidney disease: Secondary | ICD-10-CM | POA: Diagnosis not present

## 2023-12-25 ENCOUNTER — Ambulatory Visit: Payer: Self-pay

## 2023-12-25 DIAGNOSIS — J189 Pneumonia, unspecified organism: Secondary | ICD-10-CM | POA: Diagnosis not present

## 2023-12-25 DIAGNOSIS — S29012A Strain of muscle and tendon of back wall of thorax, initial encounter: Secondary | ICD-10-CM | POA: Diagnosis not present

## 2023-12-25 NOTE — Telephone Encounter (Signed)
 FYI Only or Action Required?: FYI only for provider.  Patient is followed in Pulmonology for New Pt.  Called Nurse Triage reporting Shortness of Breath.   Triage Disposition: See PCP When Office is Open (Within 3 Days), Home Care  Patient/caregiver understands and will follow disposition?: Unsure      Copied from CRM #[id number]. Topic: Clinical - Red Word Triage >> Dec 25, 2023  8:21 AM Benton KIDD wrote: [facility] that prompted transfer to Nurse Triage: pain in back shortness of breath    ----------------------------------------------------------------------- From previous Reason for Contact - Scheduling: Patient/patient representative is calling to schedule an appointment. Refer to attachments for appointment information. Reason for Disposition  [1] MODERATE back pain (e.g., interferes with normal activities) AND [2] present > 3 days  [1] Condition / symptoms BETTER (improving) AND [2] caller has additional questions triager can answer  Additional Information  Commented on: Answer Assessment    Pt reports back pain sx similar to sx that he was hospitalized with but on other side of body -- pt requesting HFU appt or imaging to r/o PNA today. Pt does endorse that he is feeling better than he was at the hospital. Triager unable to accommodate pt. Triager advised to F/U with PCP for evaluation/treatment. Pt will keep upcoming appt on 12/28/23. Patient verbalized understanding and to call back with worsening symptoms.  Answer Assessment - Initial Assessment Questions 1. MAIN CONCERN OR SYMPTOM:  What is your main concern right now? What question do you have? What's the main symptom you're worried about? (e.g., breathing difficulty, ankle swelling, weight gain.)     Concern for PNA 2. ONSET: When did the  sx  start?     > 1 week ago 3. BETTER-SAME-WORSE: Are you getting better, staying the same, or getting worse compared to the day you were discharged?     Better - reports 80%  better 4. HOSPITALIZATION: How long were you hospitalized? (e.g., days)     8 days 5. DISCHARGE DIAGNOSIS:  What problem or disease were you hospitalized for?     Acute respiratory failure with hypoxia (HCC) CAP 6. DISCHARGE DATE: What date were you discharged from the hospital?     11/26/2023 7. DISCHARGE DOCTOR: Who is the main doctor taking care of you now?     Cindy Garnette POUR, MD 8. DISCHARGE APPOINTMENT: Have you scheduled a follow-up discharge appointment with your doctor?     12/28/2023 9. DISCHARGE MEDICINES: Did the doctor (or NP/PA) who discharged you order any new medicines for you to use? If yes, have you filled the prescription and started taking the medicine?      N/a 10. PAIN: Is there any pain? If Yes, ask: How bad is it?  (Scale 0-10; or none, mild, moderate, severe)       L Back pain 4-5/10 - same pain I had before, just on other side of body Tylenol  with some relief 11. FEVER: Do you have a fever? If Yes, ask: What is it, how was it measured  and when did it start?       denies 12. OTHER SYMPTOMS: Do you have any other symptoms?       denies  Protocols used: Post-Hospitalization Follow-up Call-A-AH, Back Pain-A-AH

## 2023-12-28 ENCOUNTER — Encounter: Payer: Self-pay | Admitting: Pulmonary Disease

## 2023-12-28 ENCOUNTER — Ambulatory Visit: Admitting: Pulmonary Disease

## 2023-12-28 ENCOUNTER — Ambulatory Visit

## 2023-12-28 VITALS — BP 130/80 | HR 99 | Ht 69.0 in | Wt 212.8 lb

## 2023-12-28 DIAGNOSIS — J869 Pyothorax without fistula: Secondary | ICD-10-CM

## 2023-12-28 NOTE — Patient Instructions (Signed)
 VISIT SUMMARY:  You had a follow-up appointment today after your recent hospitalization for community-acquired pneumonia with pleural effusion. We discussed your recovery progress and addressed a recent episode of muscle strain.  YOUR PLAN:  -COMMUNITY-ACQUIRED PNEUMONIA WITH empyema: This is a lung infection that led to fluid buildup around your lungs. You were treated in the hospital with antibiotics, a chest tube, and a procedure to remove the fluid. Your recent x-ray shows that your lungs have fully expanded again, though there may be some minor scarring or fluid left. You should monitor for any recurrence of symptoms like fever or shortness of breath and contact us  if they occur.  -PULLED MUSCLE: You experienced muscle pain from a recent strain, but the symptoms have now resolved. No further action is needed at this time.  INSTRUCTIONS:  We will obtain the x-ray report from your primary care provider for confirmation. Continue to monitor for any recurrence of symptoms related to your pneumonia, such as fever or shortness of breath, and contact us  if they arise.

## 2023-12-28 NOTE — Progress Notes (Addendum)
 Jonathan Jimenez    [id number]    1945/08/17  Primary Care Physician:Larnell Hamilton, MD  Referring Physician: Larnell Hamilton, MD [address] [place],  [place] [address]  Chief complaint: Follow-up after hospitalization for community-acquired pneumonia, and empyema   HPI: 78 y.o. who  has a past medical history of Hypertension.  Discussed the use of AI scribe software for clinical note transcription with the patient, who gave verbal consent to proceed.  History of Present Illness Jonathan Jimenez is a 78 year old male who presents for follow-up after hospitalization for community-acquired pneumonia with pleural effusion.  Respiratory symptoms and recovery - Hospitalized in June for eight days due to community-acquired pneumonia with pleural effusion - Received right-sided chest tube placement and pleural lysis for loculated effusion - Pleural fluid cultures grew streptococcus intermedius - Completed antibiotic therapy during hospitalization - No prior history of lung disease - Never smoked - Since discharge, returned to approximately 75-80% of baseline functional status - No current fever or shortness of breath - Gradually resuming usual activities  Musculoskeletal pain - Recent episode of muscle strain with associated pain - Muscle pain has improved and is no longer problematic - Got a chest x-ray last week at his primary care office to evaluate muscle pain   Outpatient Encounter Medications as of 12/28/2023  Medication Sig   aspirin EC 81 MG tablet 1 tablet Orally Once a day for 30 day(s)   B Complex Vitamins (VITAMIN B COMPLEX) TABS Take 1 tablet by mouth daily.   carvedilol  (COREG ) 25 MG tablet TAKE 1 TABLET BY MOUTH TWICE A DAY FOR HYPERTENSION   doxycycline  (VIBRAMYCIN ) 100 MG capsule Take 100 mg by mouth 2 (two) times daily.   esomeprazole (NEXIUM) 20 MG capsule Take 20 mg by mouth daily as needed.   metFORMIN (GLUCOPHAGE) 500 MG tablet TAKE 1 TABLET WITH A MEAL BY  MOUTH IN THE EVENING   Multiple Vitamin (MULTIVITAMIN WITH MINERALS) TABS tablet Take 1 tablet by mouth daily.   Multiple Vitamins-Minerals (ICAPS) TABS Take 1 tablet by mouth in the morning and at bedtime.   predniSONE (DELTASONE) 20 MG tablet Take 20 mg by mouth daily.   simvastatin  (ZOCOR ) 20 MG tablet Take 20 mg by mouth every evening.   tamsulosin (FLOMAX) 0.4 MG CAPS capsule TAKE 1 CAPSULE BY MOUTH EVERYDAY AT BEDTIME   triamterene-hydrochlorothiazide (MAXZIDE) 75-50 MG per tablet Take 1 tablet by mouth daily.   No facility-administered encounter medications on file as of 12/28/2023.  Physical Exam: Blood pressure 130/80, pulse 99, height 5' 9 (1.753 m), weight 212 lb 12.8 oz (96.5 kg), SpO2 92%. Gen:      No acute distress HEENT:  EOMI, sclera anicteric Neck:     No masses; no thyromegaly Lungs:    Clear to auscultation bilaterally; normal respiratory effort CV:         Regular rate and rhythm; no murmurs Abd:      + bowel sounds; soft, non-tender; no palpable masses, no distension Ext:    No edema; adequate peripheral perfusion Skin:      Warm and dry; no rash Neuro: alert and oriented x 3 Psych: normal mood and affect  Data Reviewed: Imaging:  PFTs:  Labs:  Assessment and Plan Assessment & Plan Community-acquired pneumonia with empyema Recent hospitalization in June for community-acquired pneumonia with empyema, treated with chest tube and pleural lysis for loculated pneumothorax. Cultures showed Streptococcus intermedius. Completed antibiotic treatment in hospital.  He got  an x-ray last week at primary care.  I cannot see it on epic but he was able to pull up the images on his phone which by my review shows resolution of loculated pneumothorax and full lung expansion. Possible residual scarring or minimal fluid, but no impact on functional status. No fever or shortness of breath. Reports 75-80% recovery and he is continuing to improve - Obtain x-ray report from primary care  provider for confirmation - Advise to monitor for recurrence of symptoms and contact if issues arise  Recommendations: Get x-ray report from primary care Follow-up as needed  Scherry Laverne MD [facility] 12/28/2023, 10:50 AM  CC: Larnell Hamilton, MD  Addendum: Received chest x-ray report from primary care 12/25/2023-increased interstitial markings at the lung periphery and base which may reflect resolving acute pneumonitis.  No mass, consolidation or effusion noted.  Lonna Coder MD [facility] 12/30/2023, 6:39 PM

## 2023-12-30 NOTE — Addendum Note (Signed)
 Addended byBETHA THEOPHILUS ROOSEVELT on: 12/30/2023 06:40 PM   Modules accepted: Level of Service

## 2024-01-01 DIAGNOSIS — E876 Hypokalemia: Secondary | ICD-10-CM | POA: Diagnosis not present

## 2024-01-01 DIAGNOSIS — Z9181 History of falling: Secondary | ICD-10-CM | POA: Diagnosis not present

## 2024-01-01 DIAGNOSIS — M170 Bilateral primary osteoarthritis of knee: Secondary | ICD-10-CM | POA: Diagnosis not present

## 2024-01-01 DIAGNOSIS — J189 Pneumonia, unspecified organism: Secondary | ICD-10-CM | POA: Diagnosis not present

## 2024-01-01 DIAGNOSIS — E785 Hyperlipidemia, unspecified: Secondary | ICD-10-CM | POA: Diagnosis not present

## 2024-01-01 DIAGNOSIS — I129 Hypertensive chronic kidney disease with stage 1 through stage 4 chronic kidney disease, or unspecified chronic kidney disease: Secondary | ICD-10-CM | POA: Diagnosis not present

## 2024-01-01 DIAGNOSIS — N1831 Chronic kidney disease, stage 3a: Secondary | ICD-10-CM | POA: Diagnosis not present

## 2024-01-01 DIAGNOSIS — J9601 Acute respiratory failure with hypoxia: Secondary | ICD-10-CM | POA: Diagnosis not present

## 2024-01-01 DIAGNOSIS — K219 Gastro-esophageal reflux disease without esophagitis: Secondary | ICD-10-CM | POA: Diagnosis not present

## 2024-01-01 DIAGNOSIS — A419 Sepsis, unspecified organism: Secondary | ICD-10-CM | POA: Diagnosis not present

## 2024-01-01 DIAGNOSIS — J90 Pleural effusion, not elsewhere classified: Secondary | ICD-10-CM | POA: Diagnosis not present

## 2024-01-01 DIAGNOSIS — E1122 Type 2 diabetes mellitus with diabetic chronic kidney disease: Secondary | ICD-10-CM | POA: Diagnosis not present

## 2024-01-01 DIAGNOSIS — Z7984 Long term (current) use of oral hypoglycemic drugs: Secondary | ICD-10-CM | POA: Diagnosis not present

## 2024-01-04 DIAGNOSIS — N1831 Chronic kidney disease, stage 3a: Secondary | ICD-10-CM | POA: Diagnosis not present

## 2024-01-05 DIAGNOSIS — J189 Pneumonia, unspecified organism: Secondary | ICD-10-CM | POA: Diagnosis not present

## 2024-01-05 DIAGNOSIS — Z9181 History of falling: Secondary | ICD-10-CM | POA: Diagnosis not present

## 2024-01-05 DIAGNOSIS — E1122 Type 2 diabetes mellitus with diabetic chronic kidney disease: Secondary | ICD-10-CM | POA: Diagnosis not present

## 2024-01-05 DIAGNOSIS — I129 Hypertensive chronic kidney disease with stage 1 through stage 4 chronic kidney disease, or unspecified chronic kidney disease: Secondary | ICD-10-CM | POA: Diagnosis not present

## 2024-01-05 DIAGNOSIS — N1831 Chronic kidney disease, stage 3a: Secondary | ICD-10-CM | POA: Diagnosis not present

## 2024-01-05 DIAGNOSIS — A419 Sepsis, unspecified organism: Secondary | ICD-10-CM | POA: Diagnosis not present

## 2024-01-05 DIAGNOSIS — E876 Hypokalemia: Secondary | ICD-10-CM | POA: Diagnosis not present

## 2024-01-05 DIAGNOSIS — M170 Bilateral primary osteoarthritis of knee: Secondary | ICD-10-CM | POA: Diagnosis not present

## 2024-01-05 DIAGNOSIS — J9601 Acute respiratory failure with hypoxia: Secondary | ICD-10-CM | POA: Diagnosis not present

## 2024-01-05 DIAGNOSIS — E785 Hyperlipidemia, unspecified: Secondary | ICD-10-CM | POA: Diagnosis not present

## 2024-01-05 DIAGNOSIS — Z7984 Long term (current) use of oral hypoglycemic drugs: Secondary | ICD-10-CM | POA: Diagnosis not present

## 2024-01-05 DIAGNOSIS — J90 Pleural effusion, not elsewhere classified: Secondary | ICD-10-CM | POA: Diagnosis not present

## 2024-01-05 DIAGNOSIS — K219 Gastro-esophageal reflux disease without esophagitis: Secondary | ICD-10-CM | POA: Diagnosis not present

## 2024-01-14 DIAGNOSIS — E119 Type 2 diabetes mellitus without complications: Secondary | ICD-10-CM | POA: Diagnosis not present

## 2024-01-19 DIAGNOSIS — M170 Bilateral primary osteoarthritis of knee: Secondary | ICD-10-CM | POA: Diagnosis not present

## 2024-01-20 DIAGNOSIS — Z7984 Long term (current) use of oral hypoglycemic drugs: Secondary | ICD-10-CM | POA: Diagnosis not present

## 2024-01-20 DIAGNOSIS — A419 Sepsis, unspecified organism: Secondary | ICD-10-CM | POA: Diagnosis not present

## 2024-01-20 DIAGNOSIS — E785 Hyperlipidemia, unspecified: Secondary | ICD-10-CM | POA: Diagnosis not present

## 2024-01-20 DIAGNOSIS — J90 Pleural effusion, not elsewhere classified: Secondary | ICD-10-CM | POA: Diagnosis not present

## 2024-01-20 DIAGNOSIS — K219 Gastro-esophageal reflux disease without esophagitis: Secondary | ICD-10-CM | POA: Diagnosis not present

## 2024-01-20 DIAGNOSIS — E1122 Type 2 diabetes mellitus with diabetic chronic kidney disease: Secondary | ICD-10-CM | POA: Diagnosis not present

## 2024-01-20 DIAGNOSIS — Z9181 History of falling: Secondary | ICD-10-CM | POA: Diagnosis not present

## 2024-01-20 DIAGNOSIS — J189 Pneumonia, unspecified organism: Secondary | ICD-10-CM | POA: Diagnosis not present

## 2024-01-20 DIAGNOSIS — M170 Bilateral primary osteoarthritis of knee: Secondary | ICD-10-CM | POA: Diagnosis not present

## 2024-01-20 DIAGNOSIS — I129 Hypertensive chronic kidney disease with stage 1 through stage 4 chronic kidney disease, or unspecified chronic kidney disease: Secondary | ICD-10-CM | POA: Diagnosis not present

## 2024-01-20 DIAGNOSIS — E876 Hypokalemia: Secondary | ICD-10-CM | POA: Diagnosis not present

## 2024-01-20 DIAGNOSIS — J9601 Acute respiratory failure with hypoxia: Secondary | ICD-10-CM | POA: Diagnosis not present

## 2024-01-20 DIAGNOSIS — N1831 Chronic kidney disease, stage 3a: Secondary | ICD-10-CM | POA: Diagnosis not present

## 2024-03-18 DIAGNOSIS — Z8701 Personal history of pneumonia (recurrent): Secondary | ICD-10-CM | POA: Diagnosis not present

## 2024-03-18 DIAGNOSIS — I10 Essential (primary) hypertension: Secondary | ICD-10-CM | POA: Diagnosis not present

## 2024-03-18 DIAGNOSIS — Z23 Encounter for immunization: Secondary | ICD-10-CM | POA: Diagnosis not present

## 2024-03-18 DIAGNOSIS — E1169 Type 2 diabetes mellitus with other specified complication: Secondary | ICD-10-CM | POA: Diagnosis not present

## 2024-03-22 DIAGNOSIS — J479 Bronchiectasis, uncomplicated: Secondary | ICD-10-CM | POA: Diagnosis not present

## 2024-03-22 DIAGNOSIS — R918 Other nonspecific abnormal finding of lung field: Secondary | ICD-10-CM | POA: Diagnosis not present

## 2024-04-05 DIAGNOSIS — M25561 Pain in right knee: Secondary | ICD-10-CM | POA: Diagnosis not present

## 2024-04-05 DIAGNOSIS — M170 Bilateral primary osteoarthritis of knee: Secondary | ICD-10-CM | POA: Diagnosis not present

## 2024-04-07 ENCOUNTER — Ambulatory Visit: Payer: Self-pay | Admitting: Student

## 2024-04-12 NOTE — Progress Notes (Incomplete)
 Jonathan Jimenez    [id number]    June 14, 1945  Primary Care Physician:Larnell Hamilton, MD  Referring Physician: Larnell Hamilton, MD [address] [place],  [place] [address]  Discussed the use of AI scribe software for clinical note transcription with the patient, who gave verbal consent to proceed.  History of Present Illness Jonathan Jimenez is a 78 year old male who was admitted in June 2025 due to CAP with exudative loculated right pleural effusion, s/p R side chest tube and pleural lysis. He presents with concerns regarding interstitial lung disease following a recent CT scan. He was referred by his primary doctor for further evaluation.  In June, Raun was hospitalized for pneumonia, requiring chest tube placement. Follow-up chest x-rays were conducted, with the latest on July 25th. A CT scan on October 21st indicated potential interstitial lung disease.  He experiences no shortness of breath or productive cough. There is no history of COPD, asthma, or other pulmonary conditions prior to the pneumonia. He has never smoked and denies aspiration. Denied significant respiratory symptoms.  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  He continues woodworking as a hobby monthly.  He denies exposure to metals, welding, or other lung irritants. He has no pets and no recent exposure birds He is unaware of mold or water  damage in his home.  There is no history of autoimmune diseases or symptoms. No history of cancer, chemotherapy, or radiation therapy.   Outpatient Encounter Medications as of 04/13/2024  Medication Sig   aspirin EC 81 MG tablet 1 tablet Orally Once a day for 30 day(s)   B Complex Vitamins (VITAMIN B COMPLEX) TABS Take 1 tablet by mouth daily.   carvedilol  (COREG ) 25 MG tablet TAKE 1 TABLET BY MOUTH TWICE A DAY FOR HYPERTENSION   doxycycline  (VIBRAMYCIN ) 100 MG capsule Take 100 mg by mouth 2 (two) times daily.    esomeprazole (NEXIUM) 20 MG capsule Take 20 mg by mouth daily as needed.   metFORMIN (GLUCOPHAGE) 500 MG tablet TAKE 1 TABLET WITH A MEAL BY MOUTH IN THE EVENING   Multiple Vitamin (MULTIVITAMIN WITH MINERALS) TABS tablet Take 1 tablet by mouth daily.   Multiple Vitamins-Minerals (ICAPS) TABS Take 1 tablet by mouth in the morning and at bedtime.   predniSONE (DELTASONE) 20 MG tablet Take 20 mg by mouth daily.   simvastatin  (ZOCOR ) 20 MG tablet Take 20 mg by mouth every evening.   tamsulosin (FLOMAX) 0.4 MG CAPS capsule TAKE 1 CAPSULE BY MOUTH EVERYDAY AT BEDTIME   triamterene-hydrochlorothiazide (MAXZIDE) 75-50 MG per tablet Take 1 tablet by mouth daily.   No facility-administered encounter medications on file as of 04/13/2024.  Physical Exam: Spo2 97%  Gen:      No acute distress HEENT:  EOMI, sclera anicteric Neck:     No masses; no thyromegaly Lungs:    crackles present 1/4 base L >R lung, No wheezing CV:         Regular rate and rhythm; no murmurs Abd:      + bowel sounds; soft, non-tender; no palpable masses, no distension Ext:    No edema; adequate peripheral perfusion Skin:      Warm and dry; no rash Neuro: alert and oriented x 3 Psych: normal mood and affect  Data Reviewed: Imaging: CT chest 03/22/2024 1.  No acute process in the chest.  2.  Calcified mediastinal and bilateral hilar lymph nodes with a few calcified pulmonary granulomas,  likely sequela of prior granulomatous disease.  3.  Mild pleural thickening with scattered pleural calcifications in the bilateral posterior costophrenic angles, which may reflect sequela of prior infection or other pleural insult.  4.  Subpleural reticulations in the lateral inferior upper lobes, lower lobes, and right middle lobe with mild bronchiectasis in the bilateral lower lobes. Findings are nonspecific and may reflect early fibrotic changes. Dedicated ILD protocol CT of the chest could further evaluate if clinically indicated.   Assessment & Plan Interstitial lung disease Possible interstitial lung disease indicated by CT scan. Differential includes environmental exposure, autoimmune conditions, or idiopathic causes. Denied smoking history, significant exposures, or autoimmune diseases.   - Requested CT scan images from [facility]. I reviewed CT scan myself and it showed no specific pattern identified. Subpleural Reticular opacities and bronchiolectasis present, no honey combing L>R. I spoke with patient about this over the phone. - Ordered pulmonary function test. - Ordered autoimmune panel. - Provided questionnaire for environmental and occupational history. - Scheduled follow-up in January to review results.  Marny Patch, MD [facility] 04/12/2024, 9:48 PM

## 2024-04-13 ENCOUNTER — Telehealth: Payer: Self-pay

## 2024-04-13 ENCOUNTER — Ambulatory Visit

## 2024-04-13 VITALS — BP 140/84 | HR 72 | Temp 97.7°F | Ht 69.0 in | Wt 217.2 lb

## 2024-04-13 DIAGNOSIS — J849 Interstitial pulmonary disease, unspecified: Secondary | ICD-10-CM

## 2024-04-13 DIAGNOSIS — J869 Pyothorax without fistula: Secondary | ICD-10-CM

## 2024-04-13 LAB — C-REACTIVE PROTEIN: CRP: <0.5 mg/dL (ref 0.5–20.0)

## 2024-04-13 LAB — SEDIMENTATION RATE: Sed Rate: 32 mm/h — ABNORMAL HIGH (ref 0–20)

## 2024-04-13 NOTE — Telephone Encounter (Signed)
 Patient is dropping off CD of a CT scan from [facility]. CD is placed and Dr.Sanchez's box

## 2024-04-13 NOTE — Patient Instructions (Addendum)
 Dear Mr. Strohecker;  Unfortunately I don't have the CT images to evaluate your lung tissue. I will request the records, and you try to obtain the CD from the facility.  Based on the CT reading, it seems there is concern for Interstitial lung disease. I will start some baseline evaluation with blood test to rule out Autoimmune disease and a pulmonary function test.  I will see you 2 months to discuss further.

## 2024-04-15 LAB — RHEUMATOID FACTOR: Rheumatoid fact SerPl-aCnc: <10 [IU]/mL (ref <14)

## 2024-04-15 LAB — ANTI-NUCLEAR AB-TITER (ANA TITER): ANA Titer 1: 1:160 {titer} — ABNORMAL HIGH

## 2024-04-15 LAB — ANTI-DNA ANTIBODY, DOUBLE-STRANDED: ds DNA Ab: <1 [IU]/mL

## 2024-04-15 LAB — ANTI-SCLERODERMA ANTIBODY: Scleroderma (Scl-70) (ENA) Antibody, IgG: <1 AI

## 2024-04-15 LAB — SJOGRENS SYNDROME-B EXTRACTABLE NUCLEAR ANTIBODY: SSB (La) (ENA) Antibody, IgG: <1 AI

## 2024-04-15 LAB — CYCLIC CITRUL PEPTIDE ANTIBODY, IGG: Cyclic Citrullin Peptide Ab: <16 U

## 2024-04-15 LAB — ANA: Anti Nuclear Antibody (ANA): POSITIVE — AB

## 2024-04-15 LAB — SJOGRENS SYNDROME-A EXTRACTABLE NUCLEAR ANTIBODY: SSA (Ro) (ENA) Antibody, IgG: <1 AI

## 2024-04-15 NOTE — Telephone Encounter (Signed)
 Noted

## 2024-04-18 ENCOUNTER — Ambulatory Visit: Payer: Self-pay

## 2024-04-18 ENCOUNTER — Encounter (HOSPITAL_COMMUNITY)

## 2024-04-18 LAB — HYPERSENSITIVITY PNEUMONITIS
A. Pullulans Abs: NEGATIVE
A.Fumigatus #1 Abs: NEGATIVE
Micropolyspora faeni, IgG: NEGATIVE
Pigeon Serum Abs: NEGATIVE
Thermoact. Saccharii: NEGATIVE
Thermoactinomyces vulgaris, IgG: NEGATIVE

## 2024-04-18 LAB — RNP ANTIBODIES: ENA RNP Ab: 3.6 AI — ABNORMAL HIGH (ref 0.0–0.9)

## 2024-04-21 DIAGNOSIS — R3915 Urgency of urination: Secondary | ICD-10-CM | POA: Diagnosis not present

## 2024-04-21 DIAGNOSIS — C61 Malignant neoplasm of prostate: Secondary | ICD-10-CM | POA: Diagnosis not present

## 2024-04-21 DIAGNOSIS — N401 Enlarged prostate with lower urinary tract symptoms: Secondary | ICD-10-CM | POA: Diagnosis not present

## 2024-05-03 ENCOUNTER — Ambulatory Visit: Payer: Self-pay | Admitting: Student

## 2024-05-04 ENCOUNTER — Ambulatory Visit: Payer: Self-pay | Admitting: Student

## 2024-05-04 NOTE — H&P (View-Only) (Signed)
 TOTAL KNEE ADMISSION H&P  Patient is being admitted for right total knee arthroplasty.  Subjective:  Chief Complaint:right knee pain.  HPI: Jonathan Jimenez, 78 y.o. male, has a history of pain and functional disability in the right knee due to arthritis and has failed non-surgical conservative treatments for greater than 12 weeks to includeNSAID's and/or analgesics, corticosteriod injections, viscosupplementation injections, flexibility and strengthening excercises, use of assistive devices, and activity modification.  Onset of symptoms was gradual, starting 10 years ago with rapidlly worsening course since that time. The patient noted no past surgery on the right knee(s).  Patient currently rates pain in the right knee(s) at 10 out of 10 with activity. Patient has night pain, worsening of pain with activity and weight bearing, pain that interferes with activities of daily living, pain with passive range of motion, crepitus, and joint swelling.  Patient has evidence of subchondral cysts, subchondral sclerosis, periarticular osteophytes, joint space narrowing, and bone loss by imaging studies. There is no active infection.  Patient Active Problem List   Diagnosis Date Noted   Pleural effusion, right 11/20/2023   CAP (community acquired pneumonia) 11/19/2023   Acute respiratory failure with hypoxia (HCC) 11/18/2023   Past Medical History:  Diagnosis Date   Arthritis    Chronic kidney disease    Stage 3A   Diabetes mellitus without complication (HCC)    Hypertension     Past Surgical History:  Procedure Laterality Date   APPENDECTOMY     CATARACT EXTRACTION, BILATERAL Bilateral    COLONOSCOPY      Current Outpatient Medications  Medication Sig Dispense Refill Last Dose/Taking   amLODipine (NORVASC) 10 MG tablet Take 10 mg by mouth at bedtime.      aspirin EC 81 MG tablet Take 81 mg by mouth at bedtime.      B Complex Vitamins (VITAMIN B COMPLEX) TABS Take 1 tablet by mouth in the morning.       carvedilol  (COREG ) 25 MG tablet Take 25 mg by mouth in the morning and at bedtime.      diclofenac  Sodium (VOLTAREN ) 1 % GEL Apply 1 Application topically 4 (four) times daily as needed (pain.).      esomeprazole (NEXIUM) 20 MG capsule Take 20 mg by mouth daily as needed (acid reflux/indigestion.).      finasteride (PROSCAR) 5 MG tablet Take 5 mg by mouth at bedtime.      Menthol, Topical Analgesic, (BENGAY EX) Apply 1 Application topically 3 (three) times daily as needed (pain.).      metFORMIN (GLUCOPHAGE) 500 MG tablet Take 500 mg by mouth daily with supper.      Multiple Vitamin (MULTIVITAMIN WITH MINERALS) TABS tablet Take 1 tablet by mouth in the morning.      Multiple Vitamins-Minerals (PRESERVISION AREDS PO) Take 1 tablet by mouth in the morning and at bedtime.      simvastatin  (ZOCOR ) 20 MG tablet Take 20 mg by mouth in the morning.      tamsulosin (FLOMAX) 0.4 MG CAPS capsule Take 0.4 mg by mouth at bedtime.      triamterene-hydrochlorothiazide (MAXZIDE) 75-50 MG per tablet Take 1 tablet by mouth at bedtime.      No current facility-administered medications for this visit.   No Known Allergies  Social History   Tobacco Use   Smoking status: Never   Smokeless tobacco: Never  Substance Use Topics   Alcohol  use: No    Alcohol /week: 0.0 standard drinks of alcohol     Family History  Problem Relation Age of Onset   Colon cancer Neg Hx    Diabetes Mother    Prostate cancer Father      Review of Systems  Musculoskeletal:  Positive for arthralgias and gait problem.  All other systems reviewed and are negative.   Objective:  Physical Exam Constitutional:      Appearance: Normal appearance.  HENT:     Head: Normocephalic and atraumatic.     Nose: Nose normal.     Mouth/Throat:     Mouth: Mucous membranes are moist.     Pharynx: Oropharynx is clear.  Eyes:     Conjunctiva/sclera: Conjunctivae normal.  Cardiovascular:     Rate and Rhythm: Normal rate and regular  rhythm.     Pulses: Normal pulses.     Heart sounds: Normal heart sounds.  Pulmonary:     Effort: Pulmonary effort is normal.     Breath sounds: Normal breath sounds.  Abdominal:     General: Abdomen is flat.     Palpations: Abdomen is soft.  Genitourinary:    Comments: Deferred. Musculoskeletal:     Cervical back: Normal range of motion and neck supple.     Comments: Examination of the right knee reveals no skin wounds or lesions. He has swelling. No warmth, erythema, or effusion. Varus deformity. Tenderness to palpation medial joint line, lateral joint line, peripatellar retinacular tissues with a positive grind sign. Patellofemoral crepitation with range of motion. Range of motion 18 to 116 degrees without any ligamentous instability. No extensor lag. Painless range of motion of the hip.    Distally, there is no focal motor or sensory deficit. Palpable pedal pulses.   Skin:    General: Skin is warm and dry.     Capillary Refill: Capillary refill takes less than 2 seconds.  Neurological:     General: No focal deficit present.     Mental Status: He is alert and oriented to person, place, and time.  Psychiatric:        Mood and Affect: Mood normal.        Behavior: Behavior normal.        Thought Content: Thought content normal.        Judgment: Judgment normal.     Vital signs in last 24 hours: @VSRANGES @  Labs:   Estimated body mass index is 31.75 kg/m as calculated from the following:   Height as of 05/05/24: 5' 9 (1.753 m).   Weight as of 05/05/24: 97.5 kg.   Imaging Review Plain radiographs demonstrate severe degenerative joint disease of the right knee(s). The overall alignment issignificant varus. The bone quality appears to be adequate for age and reported activity level.      Assessment/Plan:  End stage arthritis, right knee   The patient history, physical examination, clinical judgment of the provider and imaging studies are consistent with end stage  degenerative joint disease of the right knee(s) and total knee arthroplasty is deemed medically necessary. The treatment options including medical management, injection therapy arthroscopy and arthroplasty were discussed at length. The risks and benefits of total knee arthroplasty were presented and reviewed. The risks due to aseptic loosening, infection, stiffness, patella tracking problems, thromboembolic complications and other imponderables were discussed. The patient acknowledged the explanation, agreed to proceed with the plan and consent was signed. Patient is being admitted for inpatient treatment for surgery, pain control, PT, OT, prophylactic antibiotics, VTE prophylaxis, progressive ambulation and ADL's and discharge planning. The patient is planning to be discharged  home with OPPT after an overnight stay.   Therapy Plans: outpatient therapy [facility] scheduled for 05/16/24.  Disposition: Home with son.  Planned DVT Prophylaxis: aspirin 81mg  BID DME needed: Has rolling walker.  PCP: Cleared.  TXA: IV Allergies:  - NDKA.  Anesthesia Concerns: None.  BMI: 33.8 Last HgbA1c: 6.1 Other: - Prediabetes.  - Aspirin 81mg  daily per baseline.  - Oxycodone , zofran .  - 05/05/24: Hgb 13.5, K+ 3.8, Cr. 1.18.     Patient's anticipated LOS is less than 2 midnights, meeting these requirements: - Younger than 69 - Lives within 1 hour of care - Has a competent adult at home to recover with post-op recover - NO history of  - Chronic pain requiring opiods  - Diabetes  - Coronary Artery Disease  - Heart failure  - Heart attack  - Stroke  - DVT/VTE  - Cardiac arrhythmia  - Respiratory Failure/COPD  - Renal failure  - Anemia  - Advanced Liver disease

## 2024-05-04 NOTE — Patient Instructions (Signed)
 SURGICAL WAITING ROOM VISITATION Patients having surgery or a procedure may have no more than 2 support people in the waiting area - these visitors may rotate in the visitor waiting room.   Due to an increase in RSV and influenza rates and associated hospitalizations, children ages 102 and under may not visit patients in [facility] hospitals. If the patient needs to stay at the hospital during part of their recovery, the visitor guidelines for inpatient rooms apply.  PRE-OP VISITATION  Pre-op nurse will coordinate an appropriate time for 1 support person to accompany the patient in pre-op.  This support person may not rotate.  This visitor will be contacted when the time is appropriate for the visitor to come back in the pre-op area.  Please refer to the [facility] website for the visitor guidelines for Inpatients (after your surgery is over and you are in a regular room).  You are not required to quarantine at this time prior to your surgery. However, you must do this: Hand Hygiene often Do NOT share personal items Notify your provider if you are in close contact with someone who has COVID or you develop fever 100.4 or greater, new onset of sneezing, cough, sore throat, shortness of breath or body aches.  If you test positive for Covid or have been in contact with anyone that has tested positive in the last 10 days please notify you surgeon.    Your procedure is scheduled on: 05/12/24   Report to [facility] Main Entrance: [place] entrance where the [employer] is available.   Report to admitting at: 5:15 AM  Call this number if you have any questions or problems the morning of surgery [phone]  FOLLOW ANY ADDITIONAL PRE OP INSTRUCTIONS YOU RECEIVED FROM YOUR SURGEON'S OFFICE!!!  Do not eat food after Midnight the night prior to your surgery/procedure.  After Midnight you may have the following liquids until: 4:20 AM DAY OF SURGERY  Clear Liquid Diet Water  Black  Coffee (sugar ok, NO MILK/CREAM OR CREAMERS)  Tea (sugar ok, NO MILK/CREAM OR CREAMERS) regular and decaf                             Plain Jell-O  with no fruit (NO RED)                                           Fruit ices (not with fruit pulp, NO RED)                                     Popsicles (NO RED)                                                                  Juice: NO CITRUS JUICES: only apple, WHITE grape, WHITE cranberry Sports drinks like Gatorade or Powerade (NO RED)   The day of surgery:  Drink ONE (1) Pre-Surgery Clear G2 at : 4:20 AM the morning of surgery. Drink in one sitting. Do not sip.  This drink was given  to you during your hospital pre-op appointment visit. Nothing else to drink after completing the Pre-Surgery Clear Ensure or G2 : No candy, chewing gum or throat lozenges.    Oral Hygiene is also important to reduce your risk of infection.        Remember - BRUSH YOUR TEETH THE MORNING OF SURGERY WITH YOUR REGULAR TOOTHPASTE  Do NOT smoke after Midnight the night before surgery.  STOP TAKING all Vitamins, Herbs and supplements 1 week before your surgery.   Take ONLY these medicines the morning of surgery with A SIP OF WATER : carvedilol .Esomeprazole as needed.                   You may not have any metal on your body including hair pins, jewelry, and body piercing  Do not wear lotions, powders, perfumes / cologne, or deodorant   Men may shave face and neck.  Contacts, Hearing Aids, dentures or bridgework may not be worn into surgery. DENTURES WILL BE REMOVED PRIOR TO SURGERY PLEASE DO NOT APPLY Poly grip OR ADHESIVES!!!  You may bring a small overnight bag with you on the day of surgery, only pack items that are not valuable. [facility] IS NOT RESPONSIBLE   FOR VALUABLES THAT ARE LOST OR STOLEN.   Patients discharged on the day of surgery will not be allowed to drive home.  Someone NEEDS to stay with you for the first 24 hours after anesthesia.  Do not  bring your home medications to the hospital. The Pharmacy will dispense medications listed on your medication list to you during your admission in the Hospital.  Special Instructions: Bring a copy of your healthcare power of attorney and living will documents the day of surgery, if you wish to have them scanned into your [facility]- EPIC  Please read over the following fact sheets you were given: IF YOU HAVE QUESTIONS ABOUT YOUR PRE-OP INSTRUCTIONS, PLEASE CALL [phone]  PATIENT SIGNATURE_________________________________  NURSE SIGNATURE__________________________________  ________________________________________________________________________  Pre-operative 4 CHG Bath Instructions  DYNA-Hex 4 Chlorhexidine Gluconate 4% Solution Antiseptic 4 fl. oz   You can play a key role in reducing the risk of infection after surgery. Your skin needs to be as free of germs as possible. You can reduce the number of germs on your skin by washing with CHG (chlorhexidine gluconate) soap before surgery. CHG is an antiseptic soap that kills germs and continues to kill germs even after washing.   DO NOT use if you have an allergy to chlorhexidine/CHG or antibacterial soaps. If your skin becomes reddened or irritated, stop using the CHG and notify one of our RNs at   Please shower with the CHG soap starting 4 days before surgery using the following schedule:     Please keep in mind the following:  DO NOT shave, including legs and underarms, starting the day of your first shower.   You may shave your face at any point before/day of surgery.  Place clean sheets on your bed the day you start using CHG soap. Use a clean washcloth (not used since being washed) for each shower. DO NOT sleep with pets once you start using the CHG.  CHG Shower Instructions:  If you choose to wash your hair and private area, wash first with your normal shampoo/soap.  After you use shampoo/soap, rinse your hair  and body thoroughly to remove shampoo/soap residue.  Turn the water  OFF and apply about 3 tablespoons (45 ml) of CHG soap to a  CLEAN washcloth.  Apply CHG soap ONLY FROM YOUR NECK DOWN TO YOUR TOES (washing for 3-5 minutes)  DO NOT use CHG soap on face, private areas, open wounds, or sores.  Pay special attention to the area where your surgery is being performed.  If you are having back surgery, having someone wash your back for you may be helpful. Wait 2 minutes after CHG soap is applied, then you may rinse off the CHG soap.  Pat dry with a clean towel  Put on clean clothes/pajamas   If you choose to wear lotion, please use ONLY the CHG-compatible lotions on the back of this paper.     Additional instructions for the day of surgery: DO NOT APPLY any lotions, deodorants, cologne, or perfumes.   Put on clean/comfortable clothes.  Brush your teeth.  Ask your nurse before applying any prescription medications to the skin.   CHG Compatible Lotions   Aveeno Moisturizing lotion  Cetaphil Moisturizing Cream  Cetaphil Moisturizing Lotion  Clairol Herbal Essence Moisturizing Lotion, Dry Skin  Clairol Herbal Essence Moisturizing Lotion, Extra Dry Skin  Clairol Herbal Essence Moisturizing Lotion, Normal Skin  Curel Age Defying Therapeutic Moisturizing Lotion with Alpha Hydroxy  Curel Extreme Care Body Lotion  Curel Soothing Hands Moisturizing Hand Lotion  Curel Therapeutic Moisturizing Cream, Fragrance-Free  Curel Therapeutic Moisturizing Lotion, Fragrance-Free  Curel Therapeutic Moisturizing Lotion, Original Formula  Eucerin Daily Replenishing Lotion  Eucerin Dry Skin Therapy Plus Alpha Hydroxy Crme  Eucerin Dry Skin Therapy Plus Alpha Hydroxy Lotion  Eucerin Original Crme  Eucerin Original Lotion  Eucerin Plus Crme Eucerin Plus Lotion  Eucerin TriLipid Replenishing Lotion  Keri Anti-Bacterial Hand Lotion  Keri Deep Conditioning Original Lotion Dry Skin Formula Softly Scented  Keri  Deep Conditioning Original Lotion, Fragrance Free Sensitive Skin Formula  Keri Lotion Fast Absorbing Fragrance Free Sensitive Skin Formula  Keri Lotion Fast Absorbing Softly Scented Dry Skin Formula  Keri Original Lotion  Keri Skin Renewal Lotion Keri Silky Smooth Lotion  Keri Silky Smooth Sensitive Skin Lotion  Nivea Body Creamy Conditioning Oil  Nivea Body Extra Enriched Lotion  Nivea Body Original Lotion  Nivea Body Sheer Moisturizing Lotion Nivea Crme  Nivea Skin Firming Lotion  NutraDerm 30 Skin Lotion  NutraDerm Skin Lotion  NutraDerm Therapeutic Skin Cream  NutraDerm Therapeutic Skin Lotion  ProShield Protective Hand Cream  Provon moisturizing lotion  Incentive Spirometer  An incentive spirometer is a tool that can help keep your lungs clear and active. This tool measures how well you are filling your lungs with each breath. Taking long deep breaths may help reverse or decrease the chance of developing breathing (pulmonary) problems (especially infection) following: A long period of time when you are unable to move or be active. BEFORE THE PROCEDURE  If the spirometer includes an indicator to show your best effort, your nurse or respiratory therapist will set it to a desired goal. If possible, sit up straight or lean slightly forward. Try not to slouch. Hold the incentive spirometer in an upright position. INSTRUCTIONS FOR USE  Sit on the edge of your bed if possible, or sit up as far as you can in bed or on a chair. Hold the incentive spirometer in an upright position. Breathe out normally. Place the mouthpiece in your mouth and seal your lips tightly around it. Breathe in slowly and as deeply as possible, raising the piston or the ball toward the top of the column. Hold your breath for 3-5 seconds or for as long  as possible. Allow the piston or ball to fall to the bottom of the column. Remove the mouthpiece from your mouth and breathe out normally. Rest for a few seconds  and repeat Steps 1 through 7 at least 10 times every 1-2 hours when you are awake. Take your time and take a few normal breaths between deep breaths. The spirometer may include an indicator to show your best effort. Use the indicator as a goal to work toward during each repetition. After each set of 10 deep breaths, practice coughing to be sure your lungs are clear. If you have an incision (the cut made at the time of surgery), support your incision when coughing by placing a pillow or rolled up towels firmly against it. Once you are able to get out of bed, walk around indoors and cough well. You may stop using the incentive spirometer when instructed by your caregiver.  RISKS AND COMPLICATIONS Take your time so you do not get dizzy or light-headed. If you are in pain, you may need to take or ask for pain medication before doing incentive spirometry. It is harder to take a deep breath if you are having pain. AFTER USE Rest and breathe slowly and easily. It can be helpful to keep track of a log of your progress. Your caregiver can provide you with a simple table to help with this. If you are using the spirometer at home, follow these instructions: SEEK MEDICAL CARE IF:  You are having difficultly using the spirometer. You have trouble using the spirometer as often as instructed. Your pain medication is not giving enough relief while using the spirometer. You develop fever of 100.5 F (38.1 C) or higher. SEEK IMMEDIATE MEDICAL CARE IF:  You cough up bloody sputum that had not been present before. You develop fever of 102 F (38.9 C) or greater. You develop worsening pain at or near the incision site. MAKE SURE YOU:  Understand these instructions. Will watch your condition. Will get help right away if you are not doing well or get worse. Document Released: 09/29/2006 Document Revised: 08/11/2011 Document Reviewed: 11/30/2006 [facility] Patient Information 2014 [place].   ________________________________________________________________________

## 2024-05-04 NOTE — H&P (Signed)
 TOTAL KNEE ADMISSION H&P  Patient is being admitted for right total knee arthroplasty.  Subjective:  Chief Complaint:right knee pain.  HPI: Jonathan Jimenez, 78 y.o. male, has a history of pain and functional disability in the right knee due to arthritis and has failed non-surgical conservative treatments for greater than 12 weeks to includeNSAID's and/or analgesics, corticosteriod injections, viscosupplementation injections, flexibility and strengthening excercises, use of assistive devices, and activity modification.  Onset of symptoms was gradual, starting 10 years ago with rapidlly worsening course since that time. The patient noted no past surgery on the right knee(s).  Patient currently rates pain in the right knee(s) at 10 out of 10 with activity. Patient has night pain, worsening of pain with activity and weight bearing, pain that interferes with activities of daily living, pain with passive range of motion, crepitus, and joint swelling.  Patient has evidence of subchondral cysts, subchondral sclerosis, periarticular osteophytes, joint space narrowing, and bone loss by imaging studies. There is no active infection.  Patient Active Problem List   Diagnosis Date Noted   Pleural effusion, right 11/20/2023   CAP (community acquired pneumonia) 11/19/2023   Acute respiratory failure with hypoxia (HCC) 11/18/2023   Past Medical History:  Diagnosis Date   Hypertension     Past Surgical History:  Procedure Laterality Date   APPENDECTOMY      Current Outpatient Medications  Medication Sig Dispense Refill Last Dose/Taking   amLODipine (NORVASC) 10 MG tablet Take 10 mg by mouth at bedtime.      aspirin  EC 81 MG tablet Take 81 mg by mouth at bedtime.      B Complex Vitamins (VITAMIN B COMPLEX) TABS Take 1 tablet by mouth in the morning.      carvedilol  (COREG ) 25 MG tablet Take 25 mg by mouth in the morning and at bedtime.      diclofenac  Sodium (VOLTAREN ) 1 % GEL Apply 1 Application topically 4  (four) times daily as needed (pain.).      esomeprazole (NEXIUM) 20 MG capsule Take 20 mg by mouth daily as needed (acid reflux/indigestion.).      finasteride  (PROSCAR ) 5 MG tablet Take 5 mg by mouth at bedtime.      Menthol , Topical Analgesic, (BENGAY EX) Apply 1 Application topically 3 (three) times daily as needed (pain.).      metFORMIN (GLUCOPHAGE) 500 MG tablet Take 500 mg by mouth daily with supper.      Multiple Vitamin (MULTIVITAMIN WITH MINERALS) TABS tablet Take 1 tablet by mouth in the morning.      Multiple Vitamins-Minerals (PRESERVISION AREDS PO) Take 1 tablet by mouth in the morning and at bedtime.      simvastatin  (ZOCOR ) 20 MG tablet Take 20 mg by mouth in the morning.      tamsulosin  (FLOMAX ) 0.4 MG CAPS capsule Take 0.4 mg by mouth at bedtime.      triamterene-hydrochlorothiazide (MAXZIDE) 75-50 MG per tablet Take 1 tablet by mouth at bedtime.      No current facility-administered medications for this visit.   No Known Allergies  Social History   Tobacco Use   Smoking status: Never   Smokeless tobacco: Never  Substance Use Topics   Alcohol  use: No    Alcohol /week: 0.0 standard drinks of alcohol     Family History  Problem Relation Age of Onset   Colon cancer Neg Hx    Diabetes Mother    Prostate cancer Father      Review of Systems  Musculoskeletal:  Positive for arthralgias and gait problem.  All other systems reviewed and are negative.   Objective:  Physical Exam Constitutional:      Appearance: Normal appearance.  HENT:     Head: Normocephalic and atraumatic.     Nose: Nose normal.     Mouth/Throat:     Mouth: Mucous membranes are moist.     Pharynx: Oropharynx is clear.  Eyes:     Conjunctiva/sclera: Conjunctivae normal.  Cardiovascular:     Rate and Rhythm: Normal rate and regular rhythm.     Pulses: Normal pulses.     Heart sounds: Normal heart sounds.  Pulmonary:     Effort: Pulmonary effort is normal.     Breath sounds: Normal breath  sounds.  Abdominal:     General: Abdomen is flat.     Palpations: Abdomen is soft.  Genitourinary:    Comments: Deferred. Musculoskeletal:     Cervical back: Normal range of motion and neck supple.     Comments: Examination of the right knee reveals no skin wounds or lesions. He has swelling. No warmth, erythema, or effusion. Varus deformity. Tenderness to palpation medial joint line, lateral joint line, peripatellar retinacular tissues with a positive grind sign. Patellofemoral crepitation with range of motion. Range of motion 18 to 116 degrees without any ligamentous instability. No extensor lag. Painless range of motion of the hip.    Distally, there is no focal motor or sensory deficit. Palpable pedal pulses.   Skin:    General: Skin is warm and dry.     Capillary Refill: Capillary refill takes less than 2 seconds.  Neurological:     General: No focal deficit present.     Mental Status: He is alert and oriented to person, place, and time.  Psychiatric:        Mood and Affect: Mood normal.        Behavior: Behavior normal.        Thought Content: Thought content normal.        Judgment: Judgment normal.     Vital signs in last 24 hours: @VSRANGES @  Labs:   Estimated body mass index is 32.07 kg/m as calculated from the following:   Height as of 04/13/24: 5' 9 (1.753 m).   Weight as of 04/13/24: 98.5 kg.   Imaging Review Plain radiographs demonstrate severe degenerative joint disease of the right knee(s). The overall alignment issignificant varus. The bone quality appears to be adequate for age and reported activity level.      Assessment/Plan:  End stage arthritis, right knee   The patient history, physical examination, clinical judgment of the provider and imaging studies are consistent with end stage degenerative joint disease of the right knee(s) and total knee arthroplasty is deemed medically necessary. The treatment options including medical management,  injection therapy arthroscopy and arthroplasty were discussed at length. The risks and benefits of total knee arthroplasty were presented and reviewed. The risks due to aseptic loosening, infection, stiffness, patella tracking problems, thromboembolic complications and other imponderables were discussed. The patient acknowledged the explanation, agreed to proceed with the plan and consent was signed. Patient is being admitted for inpatient treatment for surgery, pain control, PT, OT, prophylactic antibiotics, VTE prophylaxis, progressive ambulation and ADL's and discharge planning. The patient is planning to be discharged home with OPPT after an overnight stay.   Therapy Plans: outpatient therapy [facility] scheduled for 05/16/24.  Disposition: Home with son.  Planned DVT Prophylaxis: aspirin  81mg  BID DME needed:  Has rolling walker.  PCP: Cleared.  TXA: IV Allergies:  - NDKA.  Anesthesia Concerns: None.  BMI: 33.8 Last HgbA1c: Not diabetic.  Other: - Aspirin  81mg  daily per baseline.  - Oxycodone , zofran .  - Labs pending.     Patient's anticipated LOS is less than 2 midnights, meeting these requirements: - Younger than 3 - Lives within 1 hour of care - Has a competent adult at home to recover with post-op recover - NO history of  - Chronic pain requiring opiods  - Diabetes  - Coronary Artery Disease  - Heart failure  - Heart attack  - Stroke  - DVT/VTE  - Cardiac arrhythmia  - Respiratory Failure/COPD  - Renal failure  - Anemia  - Advanced Liver disease

## 2024-05-05 ENCOUNTER — Encounter (HOSPITAL_COMMUNITY)
Admission: RE | Admit: 2024-05-05 | Discharge: 2024-05-05 | Disposition: A | Source: Ambulatory Visit | Attending: Orthopedic Surgery

## 2024-05-05 ENCOUNTER — Encounter (HOSPITAL_COMMUNITY): Payer: Self-pay

## 2024-05-05 ENCOUNTER — Other Ambulatory Visit: Payer: Self-pay

## 2024-05-05 VITALS — BP 159/82 | HR 74 | Temp 98.0°F | Ht 69.0 in | Wt 215.0 lb

## 2024-05-05 DIAGNOSIS — E119 Type 2 diabetes mellitus without complications: Secondary | ICD-10-CM

## 2024-05-05 DIAGNOSIS — Z01818 Encounter for other preprocedural examination: Secondary | ICD-10-CM

## 2024-05-05 DIAGNOSIS — I129 Hypertensive chronic kidney disease with stage 1 through stage 4 chronic kidney disease, or unspecified chronic kidney disease: Secondary | ICD-10-CM | POA: Diagnosis not present

## 2024-05-05 DIAGNOSIS — M170 Bilateral primary osteoarthritis of knee: Secondary | ICD-10-CM | POA: Diagnosis not present

## 2024-05-05 DIAGNOSIS — I10 Essential (primary) hypertension: Secondary | ICD-10-CM

## 2024-05-05 DIAGNOSIS — N183 Chronic kidney disease, stage 3 unspecified: Secondary | ICD-10-CM | POA: Diagnosis not present

## 2024-05-05 DIAGNOSIS — E1122 Type 2 diabetes mellitus with diabetic chronic kidney disease: Secondary | ICD-10-CM | POA: Diagnosis not present

## 2024-05-05 HISTORY — DX: Chronic kidney disease, unspecified: N18.9

## 2024-05-05 HISTORY — DX: Type 2 diabetes mellitus without complications: E11.9

## 2024-05-05 HISTORY — DX: Unspecified osteoarthritis, unspecified site: M19.90

## 2024-05-05 LAB — BASIC METABOLIC PANEL WITH GFR
Anion gap: 10 (ref 5–15)
BUN: 21 mg/dL (ref 8–23)
CO2: 28 mmol/L (ref 22–32)
Calcium: 10 mg/dL (ref 8.9–10.3)
Chloride: 102 mmol/L (ref 98–111)
Creatinine, Ser: 1.18 mg/dL (ref 0.61–1.24)
GFR, Estimated: >60 mL/min (ref >60)
Glucose, Bld: 163 mg/dL — ABNORMAL HIGH (ref 70–99)
Potassium: 3.8 mmol/L (ref 3.5–5.1)
Sodium: 140 mmol/L (ref 135–145)

## 2024-05-05 LAB — GLUCOSE, CAPILLARY: Glucose-Capillary: 186 mg/dL — ABNORMAL HIGH (ref 70–99)

## 2024-05-05 LAB — CBC
HCT: 39.8 % (ref 39.0–52.0)
Hemoglobin: 13.5 g/dL (ref 13.0–17.0)
MCH: 31.5 pg (ref 26.0–34.0)
MCHC: 33.9 g/dL (ref 30.0–36.0)
MCV: 93 fL (ref 80.0–100.0)
Platelets: 218 K/uL (ref 150–400)
RBC: 4.28 MIL/uL (ref 4.22–5.81)
RDW: 13.2 % (ref 11.5–15.5)
WBC: 8.5 K/uL (ref 4.0–10.5)
nRBC: 0 % (ref 0.0–0.2)

## 2024-05-05 LAB — SURGICAL PCR SCREEN
MRSA, PCR: NEGATIVE
Staphylococcus aureus: NEGATIVE

## 2024-05-05 NOTE — Progress Notes (Signed)
 For Anesthesia: PCP - Larnell Hamilton, MD  Cardiologist - N/A Pulmonology : Adrien Guan, [place], MD  LOV: 04/13/24 Bowel Prep reminder:N/A  Chest x-ray - 11/26/23 CT chest: 03/22/24 EKG - 11/18/23 Stress Test -  ECHO - 11/25/23 Cardiac Cath -  Pacemaker/ICD device last checked: Pacemaker orders received: Device Rep notified:  Spinal Cord Stimulator: N/A  Sleep Study - N/A CPAP -   Fasting Blood Sugar - N/A Checks Blood Sugar _____ times a day Date and result of last Hgb A1c-  Last dose of GLP1 agonist- N/A GLP1 instructions: Hold 7 days prior to schedule (Hold 24 hours-daily)   Last dose of SGLT-2 inhibitors- N/A SGLT-2 instructions: Hold 72 hours prior to surgery  Blood Thinner Instructions:N/A Last Dose: Time last taken:  Aspirin Instructions: Last Dose: Time last taken:  Activity level: Can go up a flight of stairs and activities of daily living without stopping and without chest pain and/or shortness of breath   Able to exercise without chest pain and/or shortness of breath  Anesthesia review: Hx: HTN,CKD 3A,DIA,Acute respiratory failure (11/2023)  Patient denies shortness of breath, fever, cough and chest pain at PAT appointment   Patient verbalized understanding of instructions that were reviewed over the telephone.

## 2024-05-06 LAB — HEMOGLOBIN A1C
Hgb A1c MFr Bld: 6.1 % — ABNORMAL HIGH (ref 4.8–5.6)
Mean Plasma Glucose: 128 mg/dL

## 2024-05-10 NOTE — Anesthesia Preprocedure Evaluation (Addendum)
 Anesthesia Evaluation    Airway        Dental   Pulmonary           Cardiovascular hypertension,      Neuro/Psych    GI/Hepatic   Endo/Other  diabetes    Renal/GU      Musculoskeletal   Abdominal   Peds  Hematology   Anesthesia Other Findings   Reproductive/Obstetrics                              Anesthesia Physical Anesthesia Plan  ASA:   Anesthesia Plan:    Post-op Pain Management:    Induction:   PONV Risk Score and Plan:   Airway Management Planned:   Additional Equipment:   Intra-op Plan:   Post-operative Plan:   Informed Consent:   Plan Discussed with:   Anesthesia Plan Comments: (See PAT note 05/05/2024)         Anesthesia Quick Evaluation

## 2024-05-10 NOTE — Progress Notes (Signed)
 Anesthesia Chart Review   Case: [id number] Date/Time: 05/12/24 0705   Procedure: ARTHROPLASTY, KNEE, TOTAL, USING IMAGELESS COMPUTER-ASSISTED NAVIGATION (Right: Knee)   Anesthesia type: Spinal   Diagnosis: Primary osteoarthritis of right knee [M17.11]   Pre-op diagnosis: Right knee osteoarthritis   Location: [facility] ROOM 09 / [facility]   Surgeons: Fidel Rogue, MD       DISCUSSION:78 y.o. never smoker with h/o HTN, DM II (A1C 6.1), CKD Stage III, right knee OA scheduled for above procedure 05/12/24 with Dr. Rogue Fidel.   Pt admitted in June 2025 due to CAP with exudative loculated right pleural effusion, s/p R side chest tube and pleural lysis. Concerns for ILD on imaging. Seen by pulmonology for follow up 04/13/24. Per notes pt without shortness of breath or productive cough. PFTs and autoimmune panel ordered.  Pt to have follow up in January.   Pt reports to PAT nurse he can climb a flight of stairs without chest pain or shortness of breath.   Clearance received from PCP which states pt is cleared from cardiac and medical standpoint. Seen by PCP 03/18/2024 for preop evaluation.  Per notes GFR >60, Hgb > 12, METS >4, DM controlled.   VS: BP (!) 159/82   Pulse 74   Temp 36.7 C (Oral)   Ht 5' 9 (1.753 m)   Wt 97.5 kg   SpO2 98%   BMI 31.75 kg/m   PROVIDERS: Larnell Hamilton, MD is PCP    LABS: Labs reviewed: Acceptable for surgery. (all labs ordered are listed, but only abnormal results are displayed)  Labs Reviewed  HEMOGLOBIN A1C - Abnormal; Notable for the following components:      Result Value   Hgb A1c MFr Bld 6.1 (*)    All other components within normal limits  BASIC METABOLIC PANEL WITH GFR - Abnormal; Notable for the following components:   Glucose, Bld 163 (*)    All other components within normal limits  GLUCOSE, CAPILLARY - Abnormal; Notable for the following components:   Glucose-Capillary 186 (*)    All other components within normal limits  SURGICAL PCR  SCREEN  CBC     IMAGES:   EKG:   CV: Echo 11/25/2023  1. Left ventricular ejection fraction, by estimation, is 60 to 65%. The  left ventricle has normal function. The left ventricle has no regional  wall motion abnormalities. There is mild concentric left ventricular  hypertrophy. Left ventricular diastolic  parameters were normal.   2. Right ventricular systolic function is normal. The right ventricular  size is normal.   3. Left atrial size was mildly dilated.   4. The mitral valve is normal in structure. No evidence of mitral valve  regurgitation. No evidence of mitral stenosis.   5. The aortic valve is normal in structure. Aortic valve regurgitation is  not visualized. No aortic stenosis is present.   6. The inferior vena cava is normal in size with greater than 50%  respiratory variability, suggesting right atrial pressure of 3 mmHg.   Past Medical History:  Diagnosis Date   Arthritis    Chronic kidney disease    Stage 3A   Diabetes mellitus without complication (HCC)    Hypertension     Past Surgical History:  Procedure Laterality Date   APPENDECTOMY     CATARACT EXTRACTION, BILATERAL Bilateral    COLONOSCOPY      MEDICATIONS:  amLODipine (NORVASC) 10 MG tablet   aspirin EC 81 MG tablet   B Complex Vitamins (  VITAMIN B COMPLEX) TABS   carvedilol  (COREG ) 25 MG tablet   diclofenac  Sodium (VOLTAREN ) 1 % GEL   esomeprazole (NEXIUM) 20 MG capsule   finasteride (PROSCAR) 5 MG tablet   Menthol, Topical Analgesic, (BENGAY EX)   metFORMIN (GLUCOPHAGE) 500 MG tablet   Multiple Vitamin (MULTIVITAMIN WITH MINERALS) TABS tablet   Multiple Vitamins-Minerals (PRESERVISION AREDS PO)   simvastatin  (ZOCOR ) 20 MG tablet   tamsulosin (FLOMAX) 0.4 MG CAPS capsule   triamterene-hydrochlorothiazide (MAXZIDE) 75-50 MG per tablet   No current facility-administered medications for this encounter.   Harlene Hoots Ward, PA-C [facility] [phone]

## 2024-05-12 ENCOUNTER — Ambulatory Visit (HOSPITAL_COMMUNITY): Admitting: Anesthesiology

## 2024-05-12 ENCOUNTER — Ambulatory Visit (HOSPITAL_COMMUNITY)

## 2024-05-12 ENCOUNTER — Ambulatory Visit (HOSPITAL_COMMUNITY)
Admission: RE | Admit: 2024-05-12 | Discharge: 2024-05-13 | Disposition: A | Attending: Orthopedic Surgery | Admitting: Orthopedic Surgery

## 2024-05-12 ENCOUNTER — Other Ambulatory Visit: Payer: Self-pay

## 2024-05-12 ENCOUNTER — Encounter (HOSPITAL_COMMUNITY): Payer: Self-pay | Admitting: Orthopedic Surgery

## 2024-05-12 ENCOUNTER — Encounter (HOSPITAL_COMMUNITY): Admission: RE | Disposition: A | Payer: Self-pay | Source: Ambulatory Visit | Attending: Orthopedic Surgery

## 2024-05-12 ENCOUNTER — Encounter (HOSPITAL_COMMUNITY): Payer: Self-pay | Admitting: Medical

## 2024-05-12 DIAGNOSIS — E876 Hypokalemia: Secondary | ICD-10-CM | POA: Diagnosis not present

## 2024-05-12 DIAGNOSIS — Z471 Aftercare following joint replacement surgery: Secondary | ICD-10-CM | POA: Diagnosis not present

## 2024-05-12 DIAGNOSIS — M1711 Unilateral primary osteoarthritis, right knee: Secondary | ICD-10-CM

## 2024-05-12 DIAGNOSIS — E119 Type 2 diabetes mellitus without complications: Secondary | ICD-10-CM

## 2024-05-12 DIAGNOSIS — I129 Hypertensive chronic kidney disease with stage 1 through stage 4 chronic kidney disease, or unspecified chronic kidney disease: Secondary | ICD-10-CM | POA: Diagnosis not present

## 2024-05-12 DIAGNOSIS — E1122 Type 2 diabetes mellitus with diabetic chronic kidney disease: Secondary | ICD-10-CM | POA: Diagnosis not present

## 2024-05-12 DIAGNOSIS — Z96651 Presence of right artificial knee joint: Secondary | ICD-10-CM | POA: Diagnosis not present

## 2024-05-12 DIAGNOSIS — J9601 Acute respiratory failure with hypoxia: Secondary | ICD-10-CM | POA: Diagnosis not present

## 2024-05-12 DIAGNOSIS — N1831 Chronic kidney disease, stage 3a: Secondary | ICD-10-CM | POA: Insufficient documentation

## 2024-05-12 DIAGNOSIS — I10 Essential (primary) hypertension: Secondary | ICD-10-CM | POA: Diagnosis not present

## 2024-05-12 DIAGNOSIS — G8918 Other acute postprocedural pain: Secondary | ICD-10-CM | POA: Diagnosis not present

## 2024-05-12 DIAGNOSIS — Z01818 Encounter for other preprocedural examination: Secondary | ICD-10-CM

## 2024-05-12 HISTORY — PX: KNEE ARTHROPLASTY: SHX992

## 2024-05-12 LAB — GLUCOSE, CAPILLARY
Glucose-Capillary: 146 mg/dL — ABNORMAL HIGH (ref 70–99)
Glucose-Capillary: 155 mg/dL — ABNORMAL HIGH (ref 70–99)
Glucose-Capillary: 197 mg/dL — ABNORMAL HIGH (ref 70–99)
Glucose-Capillary: 236 mg/dL — ABNORMAL HIGH (ref 70–99)

## 2024-05-12 SURGERY — ARTHROPLASTY, KNEE, TOTAL, USING IMAGELESS COMPUTER-ASSISTED NAVIGATION
Anesthesia: Spinal | Site: Knee | Laterality: Right

## 2024-05-12 MED ORDER — PHENYLEPHRINE HCL-NACL 20-0.9 MG/250ML-% IV SOLN
INTRAVENOUS | Status: DC | PRN
  Administered 2024-05-12: 50 ug/min via INTRAVENOUS

## 2024-05-12 MED ORDER — LIDOCAINE HCL (PF) 2 % IJ SOLN
INTRAMUSCULAR | Status: AC
  Filled 2024-05-12: qty 10

## 2024-05-12 MED ORDER — CARVEDILOL 25 MG PO TABS
25.0000 mg | ORAL_TABLET | Freq: Two times a day (BID) | ORAL | Status: DC
  Administered 2024-05-12 – 2024-05-13 (×2): 25 mg via ORAL
  Filled 2024-05-12 (×2): qty 1

## 2024-05-12 MED ORDER — BUPIVACAINE-EPINEPHRINE (PF) 0.25% -1:200000 IJ SOLN
INTRAMUSCULAR | Status: AC
  Filled 2024-05-12: qty 30

## 2024-05-12 MED ORDER — FENTANYL CITRATE (PF) 50 MCG/ML IJ SOSY: 25.0000 ug | PREFILLED_SYRINGE | INTRAMUSCULAR | Status: DC | PRN

## 2024-05-12 MED ORDER — ALBUMIN HUMAN 5 % IV SOLN: INTRAVENOUS | Status: DC | PRN

## 2024-05-12 MED ORDER — ALUM & MAG HYDROXIDE-SIMETH 200-200-20 MG/5ML PO SUSP: 30.0000 mL | ORAL | Status: DC | PRN

## 2024-05-12 MED ORDER — ISOPROPYL ALCOHOL 70 % SOLN
Status: AC
  Filled 2024-05-12: qty 480

## 2024-05-12 MED ORDER — METOCLOPRAMIDE HCL 5 MG/ML IJ SOLN: 5.0000 mg | Freq: Three times a day (TID) | INTRAMUSCULAR | Status: DC | PRN

## 2024-05-12 MED ORDER — ONDANSETRON HCL 4 MG/2ML IJ SOLN
INTRAMUSCULAR | Status: AC
  Filled 2024-05-12: qty 2

## 2024-05-12 MED ORDER — BUPIVACAINE IN DEXTROSE 0.75-8.25 % IT SOLN
INTRATHECAL | Status: DC | PRN
  Administered 2024-05-12: 2 mL via INTRATHECAL

## 2024-05-12 MED ORDER — LACTATED RINGERS IV SOLN: INTRAVENOUS | Status: DC

## 2024-05-12 MED ORDER — SODIUM CHLORIDE 0.9 % IR SOLN
Status: DC | PRN
  Administered 2024-05-12: 3000 mL

## 2024-05-12 MED ORDER — 0.9 % SODIUM CHLORIDE (POUR BTL) OPTIME
TOPICAL | Status: DC | PRN
  Administered 2024-05-12: 1000 mL

## 2024-05-12 MED ORDER — ISOPROPYL ALCOHOL 70 % SOLN
Status: DC | PRN
  Administered 2024-05-12: 1 via TOPICAL

## 2024-05-12 MED ORDER — POVIDONE-IODINE 10 % EX SWAB: 2.0000 | Freq: Once | CUTANEOUS | Status: DC

## 2024-05-12 MED ORDER — PHENYLEPHRINE HCL-NACL 20-0.9 MG/250ML-% IV SOLN
INTRAVENOUS | Status: AC
  Filled 2024-05-12: qty 250

## 2024-05-12 MED ORDER — CEFAZOLIN SODIUM-DEXTROSE 2-4 GM/100ML-% IV SOLN
2.0000 g | INTRAVENOUS | Status: AC
  Administered 2024-05-12: 2 g via INTRAVENOUS
  Filled 2024-05-12: qty 100

## 2024-05-12 MED ORDER — SODIUM CHLORIDE (PF) 0.9 % IJ SOLN
INTRAMUSCULAR | Status: DC | PRN
  Administered 2024-05-12: 61 mL

## 2024-05-12 MED ORDER — LIDOCAINE HCL (PF) 2 % IJ SOLN
INTRAMUSCULAR | Status: DC | PRN
  Administered 2024-05-12: 40 mg via INTRADERMAL

## 2024-05-12 MED ORDER — ACETAMINOPHEN 10 MG/ML IV SOLN: 1000.0000 mg | Freq: Once | INTRAVENOUS | Status: DC | PRN

## 2024-05-12 MED ORDER — FENTANYL CITRATE (PF) 100 MCG/2ML IJ SOLN
INTRAMUSCULAR | Status: DC | PRN
  Administered 2024-05-12: 50 ug via INTRAVENOUS

## 2024-05-12 MED ORDER — DEXAMETHASONE SOD PHOSPHATE PF 10 MG/ML IJ SOLN
INTRAMUSCULAR | Status: DC | PRN
  Administered 2024-05-12: 5 mg via INTRAVENOUS

## 2024-05-12 MED ORDER — TAMSULOSIN HCL 0.4 MG PO CAPS
0.4000 mg | ORAL_CAPSULE | Freq: Every day | ORAL | Status: DC
  Administered 2024-05-12: 0.4 mg via ORAL
  Filled 2024-05-12: qty 1

## 2024-05-12 MED ORDER — ONDANSETRON HCL 4 MG/2ML IJ SOLN: 4.0000 mg | Freq: Four times a day (QID) | INTRAMUSCULAR | Status: DC | PRN

## 2024-05-12 MED ORDER — KETOROLAC TROMETHAMINE 30 MG/ML IJ SOLN
INTRAMUSCULAR | Status: AC
  Filled 2024-05-12: qty 1

## 2024-05-12 MED ORDER — MENTHOL 3 MG MT LOZG: 1.0000 | LOZENGE | OROMUCOSAL | Status: DC | PRN

## 2024-05-12 MED ORDER — ASPIRIN 81 MG PO CHEW
81.0000 mg | CHEWABLE_TABLET | Freq: Two times a day (BID) | ORAL | Status: DC
  Administered 2024-05-12 – 2024-05-13 (×2): 81 mg via ORAL
  Filled 2024-05-12 (×2): qty 1

## 2024-05-12 MED ORDER — SODIUM CHLORIDE 0.9 % IV SOLN: INTRAVENOUS | Status: DC

## 2024-05-12 MED ORDER — TRANEXAMIC ACID-NACL 1000-0.7 MG/100ML-% IV SOLN
1000.0000 mg | INTRAVENOUS | Status: AC
  Administered 2024-05-12: 1000 mg via INTRAVENOUS
  Filled 2024-05-12: qty 100

## 2024-05-12 MED ORDER — STERILE WATER FOR IRRIGATION IR SOLN
Status: DC | PRN
  Administered 2024-05-12: 2000 mL

## 2024-05-12 MED ORDER — OXYCODONE HCL 5 MG PO TABS
5.0000 mg | ORAL_TABLET | Freq: Once | ORAL | Status: AC | PRN
  Administered 2024-05-12: 5 mg via ORAL

## 2024-05-12 MED ORDER — HYDROMORPHONE HCL 1 MG/ML IJ SOLN: 0.5000 mg | INTRAMUSCULAR | Status: DC | PRN

## 2024-05-12 MED ORDER — ONDANSETRON HCL 4 MG/2ML IJ SOLN
INTRAMUSCULAR | Status: DC | PRN
  Administered 2024-05-12: 4 mg via INTRAVENOUS

## 2024-05-12 MED ORDER — OXYCODONE HCL 5 MG PO TABS: 5.0000 mg | ORAL_TABLET | ORAL | Status: DC | PRN

## 2024-05-12 MED ORDER — PROPOFOL 500 MG/50ML IV EMUL
INTRAVENOUS | Status: DC | PRN
  Administered 2024-05-12: 50 ug/kg/min via INTRAVENOUS

## 2024-05-12 MED ORDER — INSULIN ASPART 100 UNIT/ML IJ SOLN
0.0000 [IU] | Freq: Three times a day (TID) | INTRAMUSCULAR | Status: DC
  Administered 2024-05-12: 2 [IU] via SUBCUTANEOUS
  Administered 2024-05-13 (×2): 1 [IU] via SUBCUTANEOUS
  Filled 2024-05-12 (×2): qty 1
  Filled 2024-05-12: qty 2

## 2024-05-12 MED ORDER — BISACODYL 10 MG RE SUPP: 10.0000 mg | Freq: Every day | RECTAL | Status: DC | PRN

## 2024-05-12 MED ORDER — ROPIVACAINE HCL 5 MG/ML IJ SOLN
INTRAMUSCULAR | Status: DC | PRN
  Administered 2024-05-12: 20 mL via PERINEURAL

## 2024-05-12 MED ORDER — INSULIN ASPART 100 UNIT/ML IJ SOLN
0.0000 [IU] | Freq: Every day | INTRAMUSCULAR | Status: DC
  Administered 2024-05-12: 2 [IU] via SUBCUTANEOUS
  Filled 2024-05-12: qty 2

## 2024-05-12 MED ORDER — INSULIN ASPART 100 UNIT/ML IJ SOLN: 0.0000 [IU] | INTRAMUSCULAR | Status: DC | PRN

## 2024-05-12 MED ORDER — METHOCARBAMOL 500 MG PO TABS: 500.0000 mg | ORAL_TABLET | Freq: Four times a day (QID) | ORAL | Status: DC | PRN

## 2024-05-12 MED ORDER — ACETAMINOPHEN 500 MG PO TABS
1000.0000 mg | ORAL_TABLET | Freq: Four times a day (QID) | ORAL | Status: AC
  Administered 2024-05-12 – 2024-05-13 (×4): 1000 mg via ORAL
  Filled 2024-05-12 (×4): qty 2

## 2024-05-12 MED ORDER — ACETAMINOPHEN 500 MG PO TABS
1000.0000 mg | ORAL_TABLET | Freq: Once | ORAL | Status: AC
  Administered 2024-05-12: 1000 mg via ORAL
  Filled 2024-05-12: qty 2

## 2024-05-12 MED ORDER — FINASTERIDE 5 MG PO TABS
5.0000 mg | ORAL_TABLET | Freq: Every day | ORAL | Status: DC
  Administered 2024-05-12 – 2024-05-13 (×2): 5 mg via ORAL
  Filled 2024-05-12 (×2): qty 1

## 2024-05-12 MED ORDER — ORAL CARE MOUTH RINSE: 15.0000 mL | Freq: Once | OROMUCOSAL | Status: AC

## 2024-05-12 MED ORDER — OXYCODONE HCL 5 MG/5ML PO SOLN: 5.0000 mg | Freq: Once | ORAL | Status: AC | PRN

## 2024-05-12 MED ORDER — CEFAZOLIN SODIUM-DEXTROSE 2-4 GM/100ML-% IV SOLN
2.0000 g | Freq: Four times a day (QID) | INTRAVENOUS | Status: AC
  Administered 2024-05-12 (×2): 2 g via INTRAVENOUS
  Filled 2024-05-12 (×2): qty 100

## 2024-05-12 MED ORDER — METOCLOPRAMIDE HCL 5 MG PO TABS: 5.0000 mg | ORAL_TABLET | Freq: Three times a day (TID) | ORAL | Status: DC | PRN

## 2024-05-12 MED ORDER — KETOROLAC TROMETHAMINE 15 MG/ML IJ SOLN
7.5000 mg | Freq: Four times a day (QID) | INTRAMUSCULAR | Status: AC
  Administered 2024-05-12 – 2024-05-13 (×4): 7.5 mg via INTRAVENOUS
  Filled 2024-05-12 (×4): qty 1

## 2024-05-12 MED ORDER — ACETAMINOPHEN 325 MG PO TABS: 325.0000 mg | ORAL_TABLET | Freq: Four times a day (QID) | ORAL | Status: DC | PRN

## 2024-05-12 MED ORDER — CHLORHEXIDINE GLUCONATE 0.12 % MT SOLN
15.0000 mL | Freq: Once | OROMUCOSAL | Status: AC
  Administered 2024-05-12: 15 mL via OROMUCOSAL

## 2024-05-12 MED ORDER — ORAL CARE MOUTH RINSE: 15.0000 mL | OROMUCOSAL | Status: DC | PRN

## 2024-05-12 MED ORDER — DOCUSATE SODIUM 100 MG PO CAPS
100.0000 mg | ORAL_CAPSULE | Freq: Two times a day (BID) | ORAL | Status: DC
  Administered 2024-05-12: 100 mg via ORAL
  Filled 2024-05-12 (×3): qty 1

## 2024-05-12 MED ORDER — SIMVASTATIN 20 MG PO TABS
20.0000 mg | ORAL_TABLET | Freq: Every morning | ORAL | Status: DC
  Administered 2024-05-13: 20 mg via ORAL
  Filled 2024-05-12: qty 1

## 2024-05-12 MED ORDER — OXYCODONE HCL 5 MG PO TABS: 10.0000 mg | ORAL_TABLET | ORAL | Status: DC | PRN

## 2024-05-12 MED ORDER — ALBUMIN HUMAN 5 % IV SOLN
INTRAVENOUS | Status: AC
  Filled 2024-05-12: qty 250

## 2024-05-12 MED ORDER — FENTANYL CITRATE (PF) 100 MCG/2ML IJ SOLN
INTRAMUSCULAR | Status: AC
  Filled 2024-05-12: qty 2

## 2024-05-12 MED ORDER — OXYCODONE HCL 5 MG PO TABS
ORAL_TABLET | ORAL | Status: AC
  Filled 2024-05-12: qty 1

## 2024-05-12 MED ORDER — ONDANSETRON HCL 4 MG PO TABS: 4.0000 mg | ORAL_TABLET | Freq: Four times a day (QID) | ORAL | Status: DC | PRN

## 2024-05-12 MED ORDER — PHENOL 1.4 % MT LIQD: 1.0000 | OROMUCOSAL | Status: DC | PRN

## 2024-05-12 MED ORDER — POLYETHYLENE GLYCOL 3350 17 G PO PACK: 17.0000 g | PACK | Freq: Every day | ORAL | Status: DC | PRN

## 2024-05-12 MED ORDER — PANTOPRAZOLE SODIUM 40 MG PO TBEC
40.0000 mg | DELAYED_RELEASE_TABLET | Freq: Every day | ORAL | Status: DC
  Administered 2024-05-12 – 2024-05-13 (×2): 40 mg via ORAL
  Filled 2024-05-12 (×2): qty 1

## 2024-05-12 MED ORDER — ONDANSETRON HCL 4 MG/2ML IJ SOLN: 4.0000 mg | Freq: Once | INTRAMUSCULAR | Status: DC | PRN

## 2024-05-12 MED ORDER — EPHEDRINE SULFATE (PRESSORS) 25 MG/5ML IV SOSY
PREFILLED_SYRINGE | INTRAVENOUS | Status: DC | PRN
  Administered 2024-05-12: 10 mg via INTRAVENOUS

## 2024-05-12 MED ORDER — SENNA 8.6 MG PO TABS
1.0000 | ORAL_TABLET | Freq: Two times a day (BID) | ORAL | Status: DC
  Filled 2024-05-12 (×3): qty 1

## 2024-05-12 MED ORDER — SODIUM CHLORIDE (PF) 0.9 % IJ SOLN
INTRAMUSCULAR | Status: AC
  Filled 2024-05-12: qty 50

## 2024-05-12 MED ORDER — METHOCARBAMOL 1000 MG/10ML IJ SOLN: 500.0000 mg | Freq: Four times a day (QID) | INTRAMUSCULAR | Status: DC | PRN

## 2024-05-12 MED ORDER — DIPHENHYDRAMINE HCL 12.5 MG/5ML PO ELIX: 12.5000 mg | ORAL_SOLUTION | ORAL | Status: DC | PRN

## 2024-05-12 MED ORDER — ADULT MULTIVITAMIN W/MINERALS CH
1.0000 | ORAL_TABLET | Freq: Every morning | ORAL | Status: DC
  Administered 2024-05-13: 1 via ORAL
  Filled 2024-05-12: qty 1

## 2024-05-12 SURGICAL SUPPLY — 60 items
BAG COUNTER SPONGE SURGICOUNT (BAG) IMPLANT
BAG ZIPLOCK 12X15 (MISCELLANEOUS) ×1 IMPLANT
BATTERY INSTRU NAVIGATION (MISCELLANEOUS) ×3 IMPLANT
BLADE SAGITTAL AGGR TOOTH XLG (BLADE) ×1 IMPLANT
BLADE SAW RECIPROCATING 77.5 (BLADE) ×1 IMPLANT
BLADE SAW SAG 35X64 .89 (BLADE) ×1 IMPLANT
BNDG ELASTIC 4INX 5YD STR LF (GAUZE/BANDAGES/DRESSINGS) ×1 IMPLANT
BNDG ELASTIC 6INX 5YD STR LF (GAUZE/BANDAGES/DRESSINGS) ×1 IMPLANT
BOWL SMART MIX CTS (DISPOSABLE) IMPLANT
CEMENT BONE R 1X40 (Cement) IMPLANT
CHLORAPREP W/TINT 26 (MISCELLANEOUS) ×2 IMPLANT
COMPONENT PATELLA 3 PEG 35 (Joint) IMPLANT
COVER SURGICAL LIGHT HANDLE (MISCELLANEOUS) ×1 IMPLANT
DERMABOND ADVANCED .7 DNX12 (GAUZE/BANDAGES/DRESSINGS) ×2 IMPLANT
DRAPE SHEET LG 3/4 BI-LAMINATE (DRAPES) ×3 IMPLANT
DRAPE U-SHAPE 47X51 STRL (DRAPES) ×1 IMPLANT
DRSG AQUACEL AG ADV 3.5X10 (GAUZE/BANDAGES/DRESSINGS) ×1 IMPLANT
ELECT BLADE TIP CTD 4 INCH (ELECTRODE) ×1 IMPLANT
ELECT PENCIL ROCKER SW 15FT (MISCELLANEOUS) ×1 IMPLANT
ELECT REM PT RETURN 15FT ADLT (MISCELLANEOUS) ×1 IMPLANT
GAUZE SPONGE 4X4 12PLY STRL (GAUZE/BANDAGES/DRESSINGS) ×1 IMPLANT
GLOVE BIO SURGEON STRL SZ7 (GLOVE) ×1 IMPLANT
GLOVE BIO SURGEON STRL SZ8.5 (GLOVE) ×2 IMPLANT
GLOVE BIOGEL PI IND STRL 7.5 (GLOVE) ×1 IMPLANT
GLOVE BIOGEL PI IND STRL 8.5 (GLOVE) ×1 IMPLANT
GOWN SPEC L3 XXLG W/TWL (GOWN DISPOSABLE) ×1 IMPLANT
GOWN STRL REUS W/ TWL XL LVL3 (GOWN DISPOSABLE) ×1 IMPLANT
HOLDER FOLEY CATH W/STRAP (MISCELLANEOUS) ×1 IMPLANT
HOOD PEEL AWAY T7 (MISCELLANEOUS) ×3 IMPLANT
INSERT TIB KNEE EF/3-11 11 RT (Insert) IMPLANT
KIT TURNOVER KIT A (KITS) ×1 IMPLANT
MARKER SKIN DUAL TIP RULER LAB (MISCELLANEOUS) ×1 IMPLANT
NDL SAFETY ECLIPSE 18X1.5 (NEEDLE) ×1 IMPLANT
NDL SPNL 18GX3.5 QUINCKE PK (NEEDLE) ×1 IMPLANT
NS IRRIG 1000ML POUR BTL (IV SOLUTION) ×1 IMPLANT
PACK TOTAL KNEE CUSTOM (KITS) ×1 IMPLANT
PADDING CAST COTTON 6X4 STRL (CAST SUPPLIES) ×1 IMPLANT
PIN DRILL HDLS TROCAR 75 4PK (PIN) IMPLANT
PROSTHESIS FEM KNEE PS STD9 RT (Joint) IMPLANT
PROTECTOR NERVE ULNAR (MISCELLANEOUS) ×1 IMPLANT
SCREW FEMALE HEX FIX 25X2.5 (ORTHOPEDIC DISPOSABLE SUPPLIES) IMPLANT
SEALER BIPOLAR AQUA 6.0 (INSTRUMENTS) ×1 IMPLANT
SET HNDPC FAN SPRY TIP SCT (DISPOSABLE) ×1 IMPLANT
SET PAD KNEE POSITIONER (MISCELLANEOUS) ×1 IMPLANT
SOLUTION PRONTOSAN WOUND 350ML (IRRIGATION / IRRIGATOR) ×1 IMPLANT
SPIKE FLUID TRANSFER (MISCELLANEOUS) ×2 IMPLANT
STEM TIB ST PERS 14+30 (Stem) IMPLANT
STEM TIBIA 5 DEG SZ F R KNEE (Knees) IMPLANT
SUT MNCRL AB 3-0 PS2 18 (SUTURE) ×1 IMPLANT
SUT MON AB 2-0 CT1 36 (SUTURE) ×1 IMPLANT
SUT STRATAFIX 14 PDO 48 VLT (SUTURE) ×1 IMPLANT
SUT VIC AB 1 CTX36XBRD ANBCTR (SUTURE) ×2 IMPLANT
SUT VIC AB 2-0 CT1 TAPERPNT 27 (SUTURE) ×1 IMPLANT
SYR 3ML LL SCALE MARK (SYRINGE) ×1 IMPLANT
TOWEL GREEN STERILE FF (TOWEL DISPOSABLE) ×1 IMPLANT
TOWER CARTRIDGE SMART MIX (DISPOSABLE) IMPLANT
TRAY FOLEY MTR SLVR 16FR STAT (SET/KITS/TRAYS/PACK) ×1 IMPLANT
TUBE SUCTION HIGH CAP CLEAR NV (SUCTIONS) ×1 IMPLANT
WATER STERILE IRR 1000ML POUR (IV SOLUTION) ×2 IMPLANT
WRAP KNEE MAXI GEL POST OP (GAUZE/BANDAGES/DRESSINGS) ×1 IMPLANT

## 2024-05-12 NOTE — Anesthesia Procedure Notes (Signed)
 Spinal  Patient location during procedure: OR Start time: 05/12/2024 7:38 AM End time: 05/12/2024 7:40 AM Reason for block: surgical anesthesia  Staffing Authorized by: Keneth Lynwood POUR, MD   Performed by: Keneth Lynwood POUR, MD  Preanesthetic Checklist Completed: patient identified, IV checked, site marked, risks and benefits discussed, surgical consent, monitors and equipment checked, pre-op evaluation and timeout performed Spinal Block Patient position: sitting Prep: DuraPrep Patient monitoring: heart rate, cardiac monitor, continuous pulse ox and blood pressure Approach: midline Location: L3-4 Injection technique: single-shot Needle Needle type: Sprotte  Needle gauge: 24 G Needle length: 9 cm Assessment Sensory level: T4 Events: CSF return

## 2024-05-12 NOTE — Transfer of Care (Signed)
 Immediate Anesthesia Transfer of Care Note  Patient: Jonathan Jimenez  Procedure(s) Performed: ARTHROPLASTY, KNEE, TOTAL, USING IMAGELESS COMPUTER-ASSISTED NAVIGATION (Right: Knee)  Patient Location: MAC  Anesthesia Type:General  Level of Consciousness: awake and alert   Airway & Oxygen Therapy: Patient Spontanous Breathing and Patient connected to nasal cannula oxygen  Post-op Assessment: Report given to RN and Post -op Vital signs reviewed and stable  Post vital signs: Reviewed and stable  Last Vitals:  Vitals Value Taken Time  BP 129/75 05/12/24 10:52  Temp    Pulse 81 05/12/24 10:53  Resp 17 05/12/24 10:53  SpO2 100 % 05/12/24 10:53  Vitals shown include unfiled device data.  Last Pain:  Vitals:   05/12/24 9361  TempSrc: Oral         Complications: No notable events documented.

## 2024-05-12 NOTE — Anesthesia Procedure Notes (Signed)
 Anesthesia Regional Block: Adductor canal block   Pre-Anesthetic Checklist: , timeout performed,  Correct Patient, Correct Site, Correct Laterality,  Correct Procedure, Correct Position, site marked,  Risks and benefits discussed,  Surgical consent,  Pre-op evaluation,  At surgeon's request and post-op pain management  Laterality: Right  Prep: Maximum Sterile Barrier Precautions used, chloraprep       Needles:  Injection technique: Single-shot  Needle Type: Echogenic Needle      Needle Gauge: 20     Additional Needles:   Procedures:,,,, ultrasound used (permanent image in chart),,    Narrative:  Start time: 05/12/2024 7:05 AM End time: 05/12/2024 7:10 AM Injection made incrementally with aspirations every 5 mL.  Performed by: Personally  Anesthesiologist: Keneth Lynwood POUR, MD

## 2024-05-12 NOTE — Evaluation (Signed)
 Physical Therapy Evaluation Patient Details Name: Jonathan Jimenez MRN: [id number] DOB: 02/21/1946 Today's Date: 05/12/2024  History of Present Illness  Pt s/p R TKR and with hx of CKD, DM and htn  Clinical Impression  Pt s/p R TKR and present with decreased R LE strength/ROM and post op pain limiting functional mobility.  Pt should progress to dc home with family assist and reports first OP PT scheduled for 05/16/24.        If plan is discharge home, recommend the following: A little help with walking and/or transfers;A little help with bathing/dressing/bathroom;Assistance with cooking/housework;Assist for transportation;Help with stairs or ramp for entrance   Can travel by private vehicle        Equipment Recommendations None recommended by PT  Recommendations for Other Services       Functional Status Assessment Patient has had a recent decline in their functional status and demonstrates the ability to make significant improvements in function in a reasonable and predictable amount of time.     Precautions / Restrictions Precautions Precautions: Knee;Fall Restrictions Weight Bearing Restrictions Per Provider Order: Yes RLE Weight Bearing Per Provider Order: Weight bearing as tolerated      Mobility  Bed Mobility Overal bed mobility: Needs Assistance Bed Mobility: Supine to Sit     Supine to sit: Supervision     General bed mobility comments: for safety only    Transfers Overall transfer level: Needs assistance Equipment used: Rolling walker (2 wheels) Transfers: Sit to/from Stand Sit to Stand: Min assist, Contact guard assist           General transfer comment: cues for LE management and use of UEs to self assist    Ambulation/Gait Ambulation/Gait assistance: Contact guard assist Gait Distance (Feet): 48 Feet Assistive device: Rolling walker (2 wheels) Gait Pattern/deviations: Step-to pattern, Decreased step length - right, Decreased step length - left,  Shuffle, Trunk flexed Gait velocity: decr     General Gait Details: cues for posture, position from RW and initial sequence  Stairs            Wheelchair Mobility     Tilt Bed    Modified Rankin (Stroke Patients Only)       Balance Overall balance assessment: Needs assistance Sitting-balance support: Feet supported, No upper extremity supported Sitting balance-Leahy Scale: Good     Standing balance support: Bilateral upper extremity supported Standing balance-Leahy Scale: Poor                               Pertinent Vitals/Pain Pain Assessment Pain Assessment: 0-10 Pain Score: 3  Pain Location: R knee Pain Descriptors / Indicators: Aching, Sore Pain Intervention(s): Limited activity within patient's tolerance, Monitored during session, Premedicated before session, Ice applied    Home Living Family/patient expects to be discharged to:: Private residence Living Arrangements: Spouse/significant other Available Help at Discharge: Family;Available 24 hours/day (son in town x 1 week to assist; spouse in [place]) Type of Home: House Home Access: Level entry       Home Layout: One level Home Equipment: Agricultural Consultant (2 wheels);Lift chair Additional Comments: patient has wife at home who is w/c bound who he helps.    Prior Function Prior Level of Function : Independent/Modified Independent                     Extremity/Trunk Assessment   Upper Extremity Assessment Upper Extremity Assessment: Overall WFL for tasks assessed  Lower Extremity Assessment Lower Extremity Assessment: RLE deficits/detail    Cervical / Trunk Assessment Cervical / Trunk Assessment: Normal  Communication   Communication Communication: No apparent difficulties    Cognition Arousal: Alert Behavior During Therapy: WFL for tasks assessed/performed   PT - Cognitive impairments: No apparent impairments                         Following commands:  Intact       Cueing Cueing Techniques: Verbal cues     General Comments      Exercises Total Joint Exercises Ankle Circles/Pumps: AROM, Both, 15 reps, Supine   Assessment/Plan    PT Assessment Patient needs continued PT services  PT Problem List Decreased strength;Decreased range of motion;Decreased activity tolerance;Decreased balance;Decreased mobility;Decreased knowledge of use of DME;Obesity;Pain       PT Treatment Interventions DME instruction;Gait training;Stair training;Functional mobility training;Therapeutic activities;Therapeutic exercise;Patient/family education    PT Goals (Current goals can be found in the Care Plan section)  Acute Rehab PT Goals Patient Stated Goal: Regain IND PT Goal Formulation: With patient Time For Goal Achievement: 05/19/24 Potential to Achieve Goals: Good    Frequency 7X/week     Co-evaluation               AM-PAC PT 6 Clicks Mobility  Outcome Measure Help needed turning from your back to your side while in a flat bed without using bedrails?: A Little Help needed moving from lying on your back to sitting on the side of a flat bed without using bedrails?: A Little Help needed moving to and from a bed to a chair (including a wheelchair)?: A Little Help needed standing up from a chair using your arms (e.g., wheelchair or bedside chair)?: A Little Help needed to walk in hospital room?: A Little Help needed climbing 3-5 steps with a railing? : A Lot 6 Click Score: 17    End of Session Equipment Utilized During Treatment: Gait belt Activity Tolerance: Patient tolerated treatment well Patient left: in chair;with call bell/phone within reach;with chair alarm set;with family/visitor present;with nursing/sitter in room Nurse Communication: Mobility status PT Visit Diagnosis: Difficulty in walking, not elsewhere classified (R26.2)    Time: 8375-8354 PT Time Calculation (min) (ACUTE ONLY): 21 min   Charges:   PT Evaluation $PT  Eval Low Complexity: 1 Low   PT General Charges $$ ACUTE PT VISIT: 1 Visit         [facility] PT Acute Rehabilitation Services Office [phone]   Leylah Tarnow 05/12/2024, 4:51 PM

## 2024-05-12 NOTE — Anesthesia Postprocedure Evaluation (Signed)
 Anesthesia Post Note  Patient: Jonathan Jimenez  Procedure(s) Performed: ARTHROPLASTY, KNEE, TOTAL, USING IMAGELESS COMPUTER-ASSISTED NAVIGATION (Right: Knee)     Patient location during evaluation: PACU Anesthesia Type: Spinal Level of consciousness: awake and alert Pain management: pain level controlled Vital Signs Assessment: post-procedure vital signs reviewed and stable Respiratory status: spontaneous breathing, nonlabored ventilation, respiratory function stable and patient connected to nasal cannula oxygen Cardiovascular status: blood pressure returned to baseline and stable Postop Assessment: no apparent nausea or vomiting Anesthetic complications: no   No notable events documented.  Last Vitals:  Vitals:   05/12/24 1115 05/12/24 1130  BP: 139/80 (!) 147/71  Pulse: 81 80  Resp: (!) 21 (!) 21  Temp:  (!) 36.4 C  SpO2: 96% 95%    Last Pain:  Vitals:   05/12/24 1130  TempSrc:   PainSc: Asleep                 Lynwood MARLA Cornea

## 2024-05-12 NOTE — Op Note (Signed)
 OPERATIVE REPORT  SURGEON: Redell Shoals, MD   ASSISTANT: Valery Potters, PA-C.  PREOPERATIVE DIAGNOSIS: Primary Right knee arthritis.   POSTOPERATIVE DIAGNOSIS: Primary Right knee arthritis.   PROCEDURE: Computer assisted hybrid fixation Right total knee arthroplasty.   IMPLANTS: Zimmer Persona PPS Cementless CR femur, size 9. Persona cemented 5 degree tibia, size F, with 14 x 30 mm stem extension. Vivacit-E polyethelyene insert, size 11 mm, CR. OsseoTi 3-Peg patella, size 35 mm. Biomet bone cement.  ANESTHESIA:  MAC, Regional, and Spinal  TOURNIQUET TIME: Not utilized.   ESTIMATED BLOOD LOSS:-400 mL    ANTIBIOTICS: 2g Ancef.  DRAINS: None.  COMPLICATIONS: None   CONDITION: PACU - hemodynamically stable.   BRIEF CLINICAL NOTE: Jonathan Jimenez is a 78 y.o. male with a long-standing history of Right knee arthritis. After failing conservative management, the patient was indicated for total knee arthroplasty. The risks, benefits, and alternatives to the procedure were explained, and the patient elected to proceed.  PROCEDURE IN DETAIL: Adductor canal block was obtained in the pre-op holding area. Once inside the operative room, spinal anesthesia was obtained, and a foley catheter was inserted. The patient was then positioned and the lower extremity was prepped and draped in the normal sterile surgical fashion.  A time-out was called verifying side and site of surgery. The patient received IV antibiotics within 60 minutes of beginning the procedure. A tourniquet was not utilized.   An anterior approach to the knee was performed utilizing a midvastus arthrotomy. A medial release was performed and the patellar fat pad was excised. Stryker imageless navigation was used to cut the distal femur perpendicular to the mechanical axis. A freehand patellar resection was performed, and the patella was  sized and prepared with 3 lug holes.  Nagivation was used to make a neutral proximal tibia resection, taking 10 mm of bone from the less affected lateral side with 3 degrees of slope. The menisci were excised. A spacer block was placed, and the alignment and balance in extension were confirmed.   The distal femur was sized using the 3-degree external rotation guide referencing the posterior femoral cortex. The appropriate 4-in-1 cutting block was pinned into place. Rotation was checked using Whiteside's line, the epicondylar axis, and then confirmed with a spacer block in flexion. The remaining femoral cuts were performed, taking care to protect the MCL.  The tibia was sized and the trial tray was pinned into place. The remaining trail components were inserted. The knee was stable to varus and valgus stress through a full range of motion. The patella tracked centrally, and the PCL was well balanced. The trial components were removed, and the proximal tibial surface was prepared. The was a subchondral cyst in the medial plateau, which I curetted to a stable bony base.  Due to the remaining contained defect, I elected to cement the tibial component with a stem extender. Small drill holes were made into the subchondral bone.  The cut bony surfaces were irrigated with pulse lavage and dried with lap sponges. The final tibial component was cemented into place.  The final femoral and patellar components were impacted into place. Trial poly insert was placed, and the knee was brought into extension while the cement polymerized.  Excess cement was cleared.  The trial poly was exchanged for the real poly liner.  The knee was tested for a final time and found to be well balanced.   The wound was copiously irrigated with Prontosan solution and normal saline using pule lavage.  Marcaine solution was injected into the periarticular soft tissue.  The wound was closed in layers using #1 Vicryl and Stratafix for the fascia,  2-0 Vicryl for the subcutaneous fat, 2-0 Monocryl for the deep dermal layer, 3-0 running Monocryl subcuticular Stitch, and 4-0 Monocryl stay sutures at both ends of the wound. Dermabond was applied to the skin.  Once the glue was fully dried, an Aquacell Ag and compressive dressing were applied.  The patient was transported to the recovery room in stable condition.  Sponge, needle, and instrument counts were correct at the end of the case x2.  The patient tolerated the procedure well and there were no known complications.  Please note that a surgical assistant was a medical necessity for this procedure in order to perform it in a safe and expeditious manner. Surgical assistant was necessary to retract the ligaments and vital neurovascular structures to prevent injury to them and also necessary for proper positioning of the limb to allow for anatomic placement of the prosthesis.

## 2024-05-12 NOTE — Interval H&P Note (Signed)
 History and Physical Interval Note:  05/12/2024 7:30 AM  Jonathan Jimenez  has presented today for surgery, with the diagnosis of Right knee osteoarthritis.  The various methods of treatment have been discussed with the patient and family. After consideration of risks, benefits and other options for treatment, the patient has consented to  Procedures: ARTHROPLASTY, KNEE, TOTAL, USING IMAGELESS COMPUTER-ASSISTED NAVIGATION (Right) as a surgical intervention.  The patient's history has been reviewed, patient examined, no change in status, stable for surgery.  I have reviewed the patient's chart and labs.  Questions were answered to the patient's satisfaction.     Redell PARAS Jonathan Jimenez

## 2024-05-12 NOTE — Discharge Instructions (Signed)

## 2024-05-13 ENCOUNTER — Other Ambulatory Visit (HOSPITAL_COMMUNITY): Payer: Self-pay

## 2024-05-13 ENCOUNTER — Encounter (HOSPITAL_COMMUNITY): Payer: Self-pay | Admitting: Orthopedic Surgery

## 2024-05-13 DIAGNOSIS — E1122 Type 2 diabetes mellitus with diabetic chronic kidney disease: Secondary | ICD-10-CM | POA: Diagnosis not present

## 2024-05-13 DIAGNOSIS — I129 Hypertensive chronic kidney disease with stage 1 through stage 4 chronic kidney disease, or unspecified chronic kidney disease: Secondary | ICD-10-CM | POA: Diagnosis not present

## 2024-05-13 DIAGNOSIS — M1711 Unilateral primary osteoarthritis, right knee: Secondary | ICD-10-CM | POA: Diagnosis not present

## 2024-05-13 DIAGNOSIS — E876 Hypokalemia: Secondary | ICD-10-CM | POA: Diagnosis not present

## 2024-05-13 DIAGNOSIS — N1831 Chronic kidney disease, stage 3a: Secondary | ICD-10-CM | POA: Diagnosis not present

## 2024-05-13 LAB — CBC
HCT: 31 % — ABNORMAL LOW (ref 39.0–52.0)
Hemoglobin: 10.8 g/dL — ABNORMAL LOW (ref 13.0–17.0)
MCH: 31.4 pg (ref 26.0–34.0)
MCHC: 34.8 g/dL (ref 30.0–36.0)
MCV: 90.1 fL (ref 80.0–100.0)
Platelets: 177 K/uL (ref 150–400)
RBC: 3.44 MIL/uL — ABNORMAL LOW (ref 4.22–5.81)
RDW: 12.6 % (ref 11.5–15.5)
WBC: 12.8 K/uL — ABNORMAL HIGH (ref 4.0–10.5)
nRBC: 0 % (ref 0.0–0.2)

## 2024-05-13 LAB — BASIC METABOLIC PANEL WITH GFR
Anion gap: 10 (ref 5–15)
Anion gap: 10 (ref 5–15)
BUN: 19 mg/dL (ref 8–23)
BUN: 19 mg/dL (ref 8–23)
CO2: 25 mmol/L (ref 22–32)
CO2: 26 mmol/L (ref 22–32)
Calcium: 8.6 mg/dL — ABNORMAL LOW (ref 8.9–10.3)
Calcium: 8.8 mg/dL — ABNORMAL LOW (ref 8.9–10.3)
Chloride: 104 mmol/L (ref 98–111)
Chloride: 105 mmol/L (ref 98–111)
Creatinine, Ser: 1.08 mg/dL (ref 0.61–1.24)
Creatinine, Ser: 1.14 mg/dL (ref 0.61–1.24)
GFR, Estimated: >60 mL/min (ref >60)
GFR, Estimated: >60 mL/min (ref >60)
Glucose, Bld: 145 mg/dL — ABNORMAL HIGH (ref 70–99)
Glucose, Bld: 146 mg/dL — ABNORMAL HIGH (ref 70–99)
Potassium: 3.3 mmol/L — ABNORMAL LOW (ref 3.5–5.1)
Potassium: 3.8 mmol/L (ref 3.5–5.1)
Sodium: 139 mmol/L (ref 135–145)
Sodium: 140 mmol/L (ref 135–145)

## 2024-05-13 LAB — GLUCOSE, CAPILLARY
Glucose-Capillary: 129 mg/dL — ABNORMAL HIGH (ref 70–99)
Glucose-Capillary: 140 mg/dL — ABNORMAL HIGH (ref 70–99)

## 2024-05-13 MED ORDER — ASPIRIN 81 MG PO CHEW
81.0000 mg | CHEWABLE_TABLET | Freq: Two times a day (BID) | ORAL | 0 refills | Status: AC
  Filled 2024-05-13: qty 90, 45d supply, fill #0

## 2024-05-13 MED ORDER — POTASSIUM CHLORIDE CRYS ER 20 MEQ PO TBCR
40.0000 meq | EXTENDED_RELEASE_TABLET | Freq: Once | ORAL | Status: AC
  Administered 2024-05-13: 40 meq via ORAL
  Filled 2024-05-13: qty 2

## 2024-05-13 MED ORDER — POLYETHYLENE GLYCOL 3350 17 GM/SCOOP PO POWD
17.0000 g | Freq: Every day | ORAL | 0 refills | Status: AC | PRN
  Filled 2024-05-13: qty 238, 14d supply, fill #0

## 2024-05-13 MED ORDER — ONDANSETRON HCL 4 MG PO TABS
4.0000 mg | ORAL_TABLET | Freq: Three times a day (TID) | ORAL | 0 refills | Status: AC | PRN
  Filled 2024-05-13: qty 30, 10d supply, fill #0

## 2024-05-13 MED ORDER — SENNA 8.6 MG PO TABS
2.0000 | ORAL_TABLET | Freq: Every day | ORAL | 0 refills | Status: AC
  Filled 2024-05-13: qty 30, 15d supply, fill #0

## 2024-05-13 MED ORDER — OXYCODONE HCL 5 MG PO TABS
5.0000 mg | ORAL_TABLET | ORAL | 0 refills | Status: AC | PRN
  Filled 2024-05-13: qty 42, 7d supply, fill #0

## 2024-05-13 MED ORDER — DOCUSATE SODIUM 100 MG PO CAPS
100.0000 mg | ORAL_CAPSULE | Freq: Two times a day (BID) | ORAL | 0 refills | Status: AC
  Filled 2024-05-13: qty 60, 30d supply, fill #0

## 2024-05-13 NOTE — TOC Transition Note (Signed)
 Transition of Care [facility]) - Discharge Note   Patient Details  Name: Jonathan Jimenez MRN: [id number] Date of Birth: 04/22/1946  Transition of Care [facility]) CM/SW Contact:  [name], RN Phone Number: 05/13/2024, 10:19 AM   Clinical Narrative:   Met with patient at bedside to review dc therapy and home equipment needs, pt confirmed OPPT-[facility]O [facility], has RW. No INPT CM needs     Final next level of care: OP Rehab Barriers to Discharge: No Barriers Identified   Patient Goals and CMS Choice Patient states their goals for this hospitalization and ongoing recovery are:: return home          Discharge Placement                       Discharge Plan and Services Additional resources added to the After Visit Summary for                                       Social Drivers of Health (SDOH) Interventions SDOH Screenings   Food Insecurity: No Food Insecurity (05/12/2024)  Housing: Low Risk (05/12/2024)  Transportation Needs: No Transportation Needs (05/12/2024)  Utilities: Not At Risk (05/12/2024)  Social Connections: Unknown (05/12/2024)  Tobacco Use: Low Risk (05/12/2024)     Readmission Risk Interventions     No data to display

## 2024-05-13 NOTE — Plan of Care (Signed)
   Problem: Education: Goal: Ability to describe self-care measures that may prevent or decrease complications (Diabetes Survival Skills Education) will improve Outcome: Progressing Goal: Individualized Educational Video(s) Outcome: Progressing

## 2024-05-13 NOTE — Progress Notes (Signed)
 Discharge Medications delivered from TOC meds to bed [facility] outpatient pharmacy by this RN.

## 2024-05-13 NOTE — Progress Notes (Signed)
° ° °  Subjective:  Patient reports pain as mild.  Denies N/V/CP/SOB/Abd pain.  Denies tingling or numbness in LE bilaterally.  Catheter removed this morning, void pending.  He is eager for d/c home this date.  K+ 3.3 this am, kdurr given, recheck labs at 12:00.   Objective:   VITALS:   Vitals:   05/12/24 1524 05/12/24 1736 05/12/24 2056 05/13/24 0511  BP: (!) 143/71 (!) 149/75 135/77 124/63  Pulse: 67 82 84 71  Resp:  16 18 18   Temp: 97.9 F (36.6 C) 97.8 F (36.6 C) 97.8 F (36.6 C) 97.7 F (36.5 C)  TempSrc: Oral Oral    SpO2: 95% 97% 94% 99%  Weight:      Height:        NAD Neurologically intact ABD soft Neurovascular intact Sensation intact distally Intact pulses distally Dorsiflexion/Plantar flexion intact Incision: dressing C/D/I No cellulitis present Compartment soft   Lab Results  Component Value Date   WBC 12.8 (H) 05/13/2024   HGB 10.8 (L) 05/13/2024   HCT 31.0 (L) 05/13/2024   MCV 90.1 05/13/2024   PLT 177 05/13/2024   BMET    Component Value Date/Time   NA 139 05/13/2024 0330   K 3.3 (L) 05/13/2024 0330   CL 104 05/13/2024 0330   CO2 25 05/13/2024 0330   GLUCOSE 145 (H) 05/13/2024 0330   BUN 19 05/13/2024 0330   CREATININE 1.08 05/13/2024 0330   CALCIUM 8.6 (L) 05/13/2024 0330   GFRNONAA >60 05/13/2024 0330     Assessment/Plan: 1 Day Post-Op   Principal Problem:   Osteoarthritis of right knee Active Problems:   S/P total knee arthroplasty, right    WBAT with walker DVT ppx: Aspirin, SCDs, TEDS PO pain control PT/OT: He ambulated 48 feet with PT yesterday. Continue PT today.  Dispo:  - Hypokalemia. K+ 3.3 this am, 40 mEq kdurr given, recheck labs at 12:00pm. - If K+ within normal limits at recheck, voided, and cleared PT can d/c home with OPPT.   Jonathan Jimenez 05/13/2024, 10:28 AM   [facility]  [place] [address], [place], [place] [address] Phone:  [phone] [email or web address] Facebook  [employer]

## 2024-05-13 NOTE — Progress Notes (Signed)
 Physical Therapy Treatment Patient Details Name: Basheer Molchan MRN: [id number] DOB: 25-Nov-1945 Today's Date: 05/13/2024   History of Present Illness Pt s/p R TKR and with hx of CKD, DM and htn    PT Comments  Pt motivated and progressing well with mobility including up to hall to ambulate increased distance, improved stability with gait and HEP initiated with written instruction provided and reviewed.  Pt eager for dc home this date.    If plan is discharge home, recommend the following: A little help with walking and/or transfers;A little help with bathing/dressing/bathroom;Assistance with cooking/housework;Assist for transportation;Help with stairs or ramp for entrance   Can travel by private vehicle        Equipment Recommendations  None recommended by PT    Recommendations for Other Services       Precautions / Restrictions Precautions Precautions: Knee;Fall Restrictions Weight Bearing Restrictions Per Provider Order: No RLE Weight Bearing Per Provider Order: Weight bearing as tolerated     Mobility  Bed Mobility Overal bed mobility: Needs Assistance Bed Mobility: Supine to Sit, Sit to Supine     Supine to sit: Supervision Sit to supine: Supervision   General bed mobility comments: no physical assist    Transfers Overall transfer level: Needs assistance Equipment used: Rolling walker (2 wheels) Transfers: Sit to/from Stand Sit to Stand: Contact guard assist, Supervision           General transfer comment: min cues for LE management and use of UEs to self assist    Ambulation/Gait Ambulation/Gait assistance: Contact guard assist, Supervision Gait Distance (Feet): 75 Feet Assistive device: Rolling walker (2 wheels) Gait Pattern/deviations: Step-to pattern, Decreased step length - right, Decreased step length - left, Shuffle, Trunk flexed Gait velocity: decr     General Gait Details: min cues for posture, position from RW and initial sequence   Stairs              Wheelchair Mobility     Tilt Bed    Modified Rankin (Stroke Patients Only)       Balance Overall balance assessment: Needs assistance Sitting-balance support: Feet supported, No upper extremity supported Sitting balance-Leahy Scale: Good     Standing balance support: Bilateral upper extremity supported Standing balance-Leahy Scale: Poor                              Communication Communication Communication: No apparent difficulties  Cognition Arousal: Alert Behavior During Therapy: WFL for tasks assessed/performed   PT - Cognitive impairments: No apparent impairments                         Following commands: Intact      Cueing Cueing Techniques: Verbal cues  Exercises Total Joint Exercises Ankle Circles/Pumps: AROM, Both, 15 reps, Supine Quad Sets: AROM, 10 reps, Supine, Both Heel Slides: AAROM, Right, 15 reps, Supine Straight Leg Raises: AROM, Right, 10 reps, Supine Long Arc Quad: AAROM, AROM, Right, 10 reps, Seated    General Comments        Pertinent Vitals/Pain Pain Assessment Pain Assessment: 0-10 Pain Score: 4  Pain Location: R knee Pain Descriptors / Indicators: Aching, Sore Pain Intervention(s): Limited activity within patient's tolerance, Monitored during session, Ice applied (pt declined pain meds prior)    Home Living  Prior Function            PT Goals (current goals can now be found in the care plan section) Acute Rehab PT Goals Patient Stated Goal: Regain IND PT Goal Formulation: With patient Time For Goal Achievement: 05/19/24 Potential to Achieve Goals: Good Progress towards PT goals: Progressing toward goals    Frequency    7X/week      PT Plan      Co-evaluation              AM-PAC PT 6 Clicks Mobility   Outcome Measure  Help needed turning from your back to your side while in a flat bed without using bedrails?: A Little Help needed  moving from lying on your back to sitting on the side of a flat bed without using bedrails?: A Little Help needed moving to and from a bed to a chair (including a wheelchair)?: A Little Help needed standing up from a chair using your arms (e.g., wheelchair or bedside chair)?: A Little Help needed to walk in hospital room?: A Little Help needed climbing 3-5 steps with a railing? : A Lot 6 Click Score: 17    End of Session Equipment Utilized During Treatment: Gait belt Activity Tolerance: Patient tolerated treatment well Patient left: in bed;with call bell/phone within reach;with bed alarm set Nurse Communication: Mobility status PT Visit Diagnosis: Difficulty in walking, not elsewhere classified (R26.2)     Time: 9098-9065 PT Time Calculation (min) (ACUTE ONLY): 33 min  Charges:    $Gait Training: 8-22 mins $Therapeutic Exercise: 8-22 mins PT General Charges $$ ACUTE PT VISIT: 1 Visit                     [facility] PT Acute Rehabilitation Services Office [phone]    Zuriyah Shatz 05/13/2024, 10:50 AM

## 2024-05-13 NOTE — Discharge Summary (Signed)
 Physician Discharge Summary  Patient ID: Jonathan Jimenez MRN: [id number] DOB/AGE: 76/08/1945 77 y.o.  Admit date: 05/12/2024 Discharge date: 05/13/2024  Admission Diagnoses:  Osteoarthritis of right knee  Discharge Diagnoses:  Principal Problem:   Osteoarthritis of right knee Active Problems:   S/P total knee arthroplasty, right   Past Medical History:  Diagnosis Date   Arthritis    Chronic kidney disease    Stage 3A   Diabetes mellitus without complication (HCC)    Hypertension     Surgeries: Procedures: ARTHROPLASTY, KNEE, TOTAL, USING IMAGELESS COMPUTER-ASSISTED NAVIGATION on 05/12/2024   Consultants (if any):   Discharged Condition: Improved  Hospital Course: Jonathan Jimenez is an 78 y.o. male who was admitted 05/12/2024 with a diagnosis of Osteoarthritis of right knee and went to the operating room on 05/12/2024 and underwent the above named procedures.    He was given perioperative antibiotics:  Anti-infectives (From admission, onward)    Start     Dose/Rate Route Frequency Ordered Stop   05/12/24 1500  ceFAZolin (ANCEF) IVPB 2g/100 mL premix        2 g 200 mL/hr over 30 Minutes Intravenous Every 6 hours 05/12/24 1419 05/12/24 2134   05/12/24 0600  ceFAZolin (ANCEF) IVPB 2g/100 mL premix        2 g 200 mL/hr over 30 Minutes Intravenous On call to O.R. 05/12/24 0537 05/12/24 0825       He was given sequential compression devices, early ambulation, and aspirin for DVT prophylaxis.  POD#1 Patient doing well. Slight hypokalemia, corrected with 40mEq Kdurr. He ambulated 75 feet with PT. D/c home with OPPT. Follow-up in 2 weeks for PO visit.   He benefited maximally from the hospital stay and there were no complications.    Recent vital signs:  Vitals:   05/12/24 2056 05/13/24 0511  BP: 135/77 124/63  Pulse: 84 71  Resp: 18 18  Temp: 97.8 F (36.6 C) 97.7 F (36.5 C)  SpO2: 94% 99%    Recent laboratory studies:  Lab Results  Component Value Date   HGB  10.8 (L) 05/13/2024   HGB 13.5 05/05/2024   HGB 11.3 (L) 11/26/2023   Lab Results  Component Value Date   WBC 12.8 (H) 05/13/2024   PLT 177 05/13/2024   Lab Results  Component Value Date   INR 1.6 (H) 11/19/2023   Lab Results  Component Value Date   NA 140 05/13/2024   K 3.8 05/13/2024   CL 105 05/13/2024   CO2 26 05/13/2024   BUN 19 05/13/2024   CREATININE 1.14 05/13/2024   GLUCOSE 146 (H) 05/13/2024     Allergies as of 05/13/2024   No Known Allergies      Medication List     STOP taking these medications    aspirin EC 81 MG tablet Replaced by: aspirin 81 MG chewable tablet       TAKE these medications    amLODipine 10 MG tablet Commonly known as: NORVASC Take 10 mg by mouth at bedtime.   aspirin 81 MG chewable tablet Commonly known as: Aspirin Childrens Chew 1 tablet (81 mg total) by mouth 2 (two) times daily with a meal. Replaces: aspirin EC 81 MG tablet   BENGAY EX Apply 1 Application topically 3 (three) times daily as needed (pain.).   carvedilol  25 MG tablet Commonly known as: COREG  Take 25 mg by mouth in the morning and at bedtime.   docusate sodium 100 MG capsule Commonly known as: Colace Take 1 capsule (100  mg total) by mouth 2 (two) times daily.   finasteride 5 MG tablet Commonly known as: PROSCAR Take 5 mg by mouth at bedtime.   metFORMIN 500 MG tablet Commonly known as: GLUCOPHAGE Take 500 mg by mouth daily with supper.   multivitamin with minerals Tabs tablet Take 1 tablet by mouth in the morning.   NexIUM 20 MG capsule Generic drug: esomeprazole Take 20 mg by mouth daily as needed (acid reflux/indigestion.).   ondansetron  4 MG tablet Commonly known as: Zofran  Take 1 tablet (4 mg total) by mouth every 8 (eight) hours as needed for nausea or vomiting.   oxyCODONE  5 MG immediate release tablet Commonly known as: Roxicodone  Take 1 tablet (5 mg total) by mouth every 4 (four) hours as needed for severe pain (pain score  7-10).   polyethylene glycol 17 g packet Commonly known as: MiraLax  Take 17 g by mouth daily as needed for mild constipation or moderate constipation.   PRESERVISION AREDS PO Take 1 tablet by mouth in the morning and at bedtime.   senna 8.6 MG Tabs tablet Commonly known as: SENOKOT Take 2 tablets (17.2 mg total) by mouth at bedtime for 15 days.   simvastatin  20 MG tablet Commonly known as: ZOCOR  Take 20 mg by mouth in the morning.   tamsulosin 0.4 MG Caps capsule Commonly known as: FLOMAX Take 0.4 mg by mouth at bedtime.   triamterene-hydrochlorothiazide 75-50 MG tablet Commonly known as: MAXZIDE Take 1 tablet by mouth at bedtime.   Vitamin B Complex Tabs Take 1 tablet by mouth in the morning.   Voltaren  1 % Gel Generic drug: diclofenac  Sodium Apply 1 Application topically 4 (four) times daily as needed (pain.).               Discharge Care Instructions  (From admission, onward)           Start     Ordered   05/13/24 0000  Weight bearing as tolerated        05/13/24 1355   05/13/24 0000  Change dressing       Comments: Do not remove your dressing.   05/13/24 1355              WEIGHT BEARING   Weight bearing as tolerated with assist device (walker, cane, etc) as directed, use it as long as suggested by your surgeon or therapist, typically at least 4-6 weeks.   EXERCISES  Results after joint replacement surgery are often greatly improved when you follow the exercise, range of motion and muscle strengthening exercises prescribed by your doctor. Safety measures are also important to protect the joint from further injury. Any time any of these exercises cause you to have increased pain or swelling, decrease what you are doing until you are comfortable again and then slowly increase them. If you have problems or questions, call your caregiver or physical therapist for advice.   Rehabilitation is important following a joint replacement. After just a few  days of immobilization, the muscles of the leg can become weakened and shrink (atrophy).  These exercises are designed to build up the tone and strength of the thigh and leg muscles and to improve motion. Often times heat used for twenty to thirty minutes before working out will loosen up your tissues and help with improving the range of motion but do not use heat for the first two weeks following surgery (sometimes heat can increase post-operative swelling).   These exercises can be done on a  training (exercise) mat, on the floor, on a table or on a bed. Use whatever works the best and is most comfortable for you.    Use music or television while you are exercising so that the exercises are a pleasant break in your day. This will make your life better with the exercises acting as a break in your routine that you can look forward to.   Perform all exercises about fifteen times, three times per day or as directed.  You should exercise both the operative leg and the other leg as well.  Exercises include:   Quad Sets - Tighten up the muscle on the front of the thigh (Quad) and hold for 5-10 seconds.   Straight Leg Raises - With your knee straight (if you were given a brace, keep it on), lift the leg to 60 degrees, hold for 3 seconds, and slowly lower the leg.  Perform this exercise against resistance later as your leg gets stronger.  Leg Slides: Lying on your back, slowly slide your foot toward your buttocks, bending your knee up off the floor (only go as far as is comfortable). Then slowly slide your foot back down until your leg is flat on the floor again.  Angel Wings: Lying on your back spread your legs to the side as far apart as you can without causing discomfort.  Hamstring Strength:  Lying on your back, push your heel against the floor with your leg straight by tightening up the muscles of your buttocks.  Repeat, but this time bend your knee to a comfortable angle, and push your heel against the  floor.  You may put a pillow under the heel to make it more comfortable if necessary.   A rehabilitation program following joint replacement surgery can speed recovery and prevent re-injury in the future due to weakened muscles. Contact your doctor or a physical therapist for more information on knee rehabilitation.    CONSTIPATION  Constipation is defined medically as fewer than three stools per week and severe constipation as less than one stool per week.  Even if you have a regular bowel pattern at home, your normal regimen is likely to be disrupted due to multiple reasons following surgery.  Combination of anesthesia, postoperative narcotics, change in appetite and fluid intake all can affect your bowels.   YOU MUST use at least one of the following options; they are listed in order of increasing strength to get the job done.  They are all available over the counter, and you may need to use some, POSSIBLY even all of these options:    Drink plenty of fluids (prune juice may be helpful) and high fiber foods Colace 100 mg by mouth twice a day  Senokot for constipation as directed and as needed Dulcolax (bisacodyl), take with full glass of water   Miralax  (polyethylene glycol) once or twice a day as needed.  If you have tried all these things and are unable to have a bowel movement in the first 3-4 days after surgery call either your surgeon or your primary doctor.    If you experience loose stools or diarrhea, hold the medications until you stool forms back up.  If your symptoms do not get better within 1 week or if they get worse, check with your doctor.  If you experience the worst abdominal pain ever or develop nausea or vomiting, please contact the office immediately for further recommendations for treatment.   ITCHING:  If you experience itching with your  medications, try taking only a single pain pill, or even half a pain pill at a time.  You can also use Benadryl over the counter for  itching or also to help with sleep.   TED HOSE STOCKINGS:  Use stockings on both legs until for at least 2 weeks or as directed by physician office. They may be removed at night for sleeping.  MEDICATIONS:  See your medication summary on the After Visit Summary that nursing will review with you.  You may have some home medications which will be placed on hold until you complete the course of blood thinner medication.  It is important for you to complete the blood thinner medication as prescribed.  PRECAUTIONS:  If you experience chest pain or shortness of breath - call 911 immediately for transfer to the hospital emergency department.   If you develop a fever greater that 101 F, purulent drainage from wound, increased redness or drainage from wound, foul odor from the wound/dressing, or calf pain - CONTACT YOUR SURGEON.                                                   FOLLOW-UP APPOINTMENTS:  If you do not already have a post-op appointment, please call the office for an appointment to be seen by your surgeon.  Guidelines for how soon to be seen are listed in your After Visit Summary, but are typically between 1-4 weeks after surgery.  OTHER INSTRUCTIONS:   Knee Replacement:  Do not place pillow under knee, focus on keeping the knee straight while resting. CPM instructions: 0-90 degrees, 2 hours in the morning, 2 hours in the afternoon, and 2 hours in the evening. Place foam block, curve side up under heel at all times except when in CPM or when walking.  DO NOT modify, tear, cut, or change the foam block in any way.   MAKE SURE YOU:  Understand these instructions.  Get help right away if you are not doing well or get worse.    Thank you for letting us  be a part of your medical care team.  It is a privilege we respect greatly.  We hope these instructions will help you stay on track for a fast and full recovery!   Diagnostic Studies: DG Knee Right Port Result Date: 05/12/2024 CLINICAL  DATA:  Status post total knee replacement. EXAM: PORTABLE RIGHT KNEE - 1-2 VIEW COMPARISON:  None Available. FINDINGS: Two views study shows tricompartmental knee replacement. No evidence for immediate hardware complication. Gas in the joint space in overlying soft tissues is compatible with the immediate postoperative state. IMPRESSION: Status post tricompartmental knee replacement without evidence for immediate hardware complication. Electronically Signed   By: Camellia Candle M.D.   On: 05/12/2024 11:39    Disposition: Discharge disposition: 01-Home or Self Care       Discharge Instructions     Call MD / Call 911   Complete by: As directed    If you experience chest pain or shortness of breath, CALL 911 and be transported to the hospital emergency room.  If you develope a fever above 101 F, pus (white drainage) or increased drainage or redness at the wound, or calf pain, call your surgeon's office.   Change dressing   Complete by: As directed    Do not remove your dressing.  Constipation Prevention   Complete by: As directed    Drink plenty of fluids.  Prune juice may be helpful.  You may use a stool softener, such as Colace (over the counter) 100 mg twice a day.  Use MiraLax  (over the counter) for constipation as needed.   Diet - low sodium heart healthy   Complete by: As directed    Discharge instructions   Complete by: As directed    Elevate toes above nose. Use cryotherapy as needed for pain and swelling.   Do not put a pillow under the knee. Place it under the heel.   Complete by: As directed    Driving restrictions   Complete by: As directed    No driving for 6 weeks   Increase activity slowly as tolerated   Complete by: As directed    Lifting restrictions   Complete by: As directed    No lifting for 6 weeks   Post-operative opioid taper instructions:   Complete by: As directed    POST-OPERATIVE OPIOID TAPER INSTRUCTIONS: It is important to wean off of your opioid  medication as soon as possible. If you do not need pain medication after your surgery it is ok to stop day one. Opioids include: Codeine, Hydrocodone(Norco, Vicodin), Oxycodone (Percocet, oxycontin ) and hydromorphone amongst others.  Long term and even short term use of opiods can cause: Increased pain response Dependence Constipation Depression Respiratory depression And more.  Withdrawal symptoms can include Flu like symptoms Nausea, vomiting And more Techniques to manage these symptoms Hydrate well Eat regular healthy meals Stay active Use relaxation techniques(deep breathing, meditating, yoga) Do Not substitute Alcohol  to help with tapering If you have been on opioids for less than two weeks and do not have pain than it is ok to stop all together.  Plan to wean off of opioids This plan should start within one week post op of your joint replacement. Maintain the same interval or time between taking each dose and first decrease the dose.  Cut the total daily intake of opioids by one tablet each day Next start to increase the time between doses. The last dose that should be eliminated is the evening dose.      TED hose   Complete by: As directed    Use stockings (TED hose) for 2 weeks on both leg(s).  You may remove them at night for sleeping.   Weight bearing as tolerated   Complete by: As directed         Follow-up Information     Leigh Valery RAMAN, PA-C. Schedule an appointment as soon as possible for a visit in 2 week(s).   Specialty: Orthopedic Surgery Why: For suture removal, For wound re-check Contact information: [address], [address] [place] [address] [phone]                  Signed: Valery RAMAN Leigh 05/13/2024, 1:57 PM

## 2024-05-16 DIAGNOSIS — M25561 Pain in right knee: Secondary | ICD-10-CM | POA: Diagnosis not present

## 2024-05-18 DIAGNOSIS — M25561 Pain in right knee: Secondary | ICD-10-CM | POA: Diagnosis not present

## 2024-06-29 NOTE — Progress Notes (Signed)
 "              Jonathan Jimenez    [id number]    06/20/1945  Primary Care Physician:Larnell Hamilton, MD  Referring Physician: Larnell Hamilton, MD [address] [place],  [place] [address]  History of Present Illness Jonathan Jimenez is a 79 year old male who was admitted in June 2025 due to CAP with exudative loculated right pleural effusion, s/p R side chest tube and pleural lytics. He presents with concerns regarding interstitial lung disease following a recent CT scan on Oct 2025.  He experiences no shortness of breath or productive cough. There is no history of COPD, asthma, or other pulmonary conditions prior to the parapneumonic effusion. He has never smoked and denies aspiration. Denied significant respiratory symptoms.  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  He continues woodworking as a hobby monthly.  He denies exposure to metals, welding, or other lung irritants. He has no pets and no recent exposure birds He is unaware of mold or water  damage in his home.  There is no history of autoimmune diseases or symptoms. No history of cancer, chemotherapy, or radiation therapy.  Interval Hx: Patient feels fine. Denied respiratory symptoms. He feels at baseline. Denied SOB. He denied autoimmune conditions. He was not able to perform PFTs  Outpatient Encounter Medications as of 07/01/2024  Medication Sig   amLODipine (NORVASC) 10 MG tablet Take 10 mg by mouth at bedtime.   B Complex Vitamins (VITAMIN B COMPLEX) TABS Take 1 tablet by mouth in the morning.   carvedilol  (COREG ) 25 MG tablet Take 25 mg by mouth in the morning and at bedtime.   diclofenac  Sodium (VOLTAREN ) 1 % GEL Apply 1 Application topically 4 (four) times daily as needed (pain.).   esomeprazole (NEXIUM) 20 MG capsule Take 20 mg by mouth daily as needed (acid reflux/indigestion.).   finasteride  (PROSCAR ) 5 MG tablet Take 5 mg by mouth at bedtime.   Menthol , Topical Analgesic,  (BENGAY EX) Apply 1 Application topically 3 (three) times daily as needed (pain.).   metFORMIN (GLUCOPHAGE) 500 MG tablet Take 500 mg by mouth daily with supper.   Multiple Vitamin (MULTIVITAMIN WITH MINERALS) TABS tablet Take 1 tablet by mouth in the morning.   Multiple Vitamins-Minerals (PRESERVISION AREDS PO) Take 1 tablet by mouth in the morning and at bedtime.   ondansetron  (ZOFRAN ) 4 MG tablet Take 1 tablet (4 mg total) by mouth every 8 (eight) hours as needed for nausea or vomiting.   oxyCODONE  (ROXICODONE ) 5 MG immediate release tablet Take 1 tablet (5 mg total) by mouth every 4 (four) hours as needed for severe pain (pain score 7-10).   simvastatin  (ZOCOR ) 20 MG tablet Take 20 mg by mouth in the morning.   tamsulosin  (FLOMAX ) 0.4 MG CAPS capsule Take 0.4 mg by mouth at bedtime.   triamterene-hydrochlorothiazide (MAXZIDE) 75-50 MG per tablet Take 1 tablet by mouth at bedtime.   No facility-administered encounter medications on file as of 07/01/2024.  Physical Exam: Spo2 98%  Gen:      No acute distress HEENT:  EOMI, sclera anicteric Neck:     No masses; no thyromegaly Lungs:    bilateral crackles present up to 1/4, No wheezing CV:         Regular rate and rhythm; no murmurs Abd:      + bowel sounds; soft, non-tender; no palpable masses, no distension Ext:    No edema; adequate peripheral perfusion Skin:  Warm and dry; no rash Neuro: alert and oriented x 3 Psych: normal mood and affect  Data Reviewed: Imaging: CT chest 03/22/2024 1.  No acute process in the chest.  2.  Calcified mediastinal and bilateral hilar lymph nodes with a few calcified pulmonary granulomas, likely sequela of prior granulomatous disease.  3.  Mild pleural thickening with scattered pleural calcifications in the bilateral posterior costophrenic angles, which may reflect sequela of prior infection or other pleural insult.  4.  Subpleural reticulations in the lateral inferior upper lobes, lower lobes, and  right middle lobe with mild bronchiectasis in the bilateral lower lobes. Findings are nonspecific and may reflect early fibrotic changes. Dedicated ILD protocol CT of the chest could further evaluate if clinically indicated.   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 No prior CT scan to compare. Differential includes environmental exposure - use to work in holiday representative for many years, autoimmune conditions (+ANA, +RNP), or idiopathic causes. Denied smoking history or autoimmune diseases.  Given her age, and asymptomatic, I will monitor for with PFTs. If progression is evident,  I will start with antifibrotic regimen.  No a VATs biopsy candidate due to age.  - Ordered pulmonary function test. - Provided questionnaire for environmental and occupational history. He forgot to fill it out. - RTC in 4 months   Marny Patch, MD [facility] 06/29/2024, 2:54 PM    "

## 2024-07-01 ENCOUNTER — Ambulatory Visit

## 2024-07-01 VITALS — BP 136/72 | HR 83 | Temp 97.0°F | Ht 67.5 in | Wt 215.2 lb

## 2024-07-01 DIAGNOSIS — J849 Interstitial pulmonary disease, unspecified: Secondary | ICD-10-CM

## 2024-07-01 NOTE — Patient Instructions (Addendum)
 Dear Jonathan Jimenez;   Given that there are mild changes on the CT scan, I will recommend to monitor for now with pulmonary function test. If this continue to progress I will discuss with you some possible therapies.   I will see you in 4 months with pulmonary function test.

## 2024-07-06 NOTE — Progress Notes (Signed)
 Anesthesia Review:  PCP: Cardiologist :  PPM/ ICD: Device Orders: Rep Notified:  Chest x-ray : EKG : Echo : Stress test: Cardiac Cath :   Activity level:  Sleep Study/ CPAP : Fasting Blood Sugar :      / Checks Blood Sugar -- times a day:   DM- type    Hgba1c-05/05/24-6.1   Metformin- none am of surgery   Blood Thinner/ Instructions /Last Dose: ASA / Instructions/ Last Dose :    81 mg aspirin 

## 2024-07-07 NOTE — Progress Notes (Signed)
 Sent message, via epic in basket, requesting orders in epic from Careers adviser.

## 2024-07-07 NOTE — Patient Instructions (Signed)
 SURGICAL WAITING ROOM VISITATION  Patients having surgery or a procedure may have no more than 2 support people in the waiting area - these visitors may rotate.    Children ages 71 and under will not be able to visit patients in [facility] under most circumstances.   Visitors with respiratory illnesses are discouraged from visiting and should remain at home.  If the patient needs to stay at the hospital during part of their recovery, the visitor guidelines for inpatient rooms apply. Pre-op nurse will coordinate an appropriate time for 1 support person to accompany patient in pre-op.  This support person may not rotate.    Please refer to the [facility] website for the visitor guidelines for Inpatients (after your surgery is over and you are in a regular room).       Your procedure is scheduled on:  07/20/2024    Report to [facility] Main Entrance    Report to admitting at  0900 AM   Call this number if you have problems the morning of surgery [phone]   Do not eat food :After Midnight.   After Midnight you may have the following liquids until _ 0815_____ AM  DAY OF SURGERY  Water  Non-Citrus Juices (without pulp, NO RED-Apple, White grape, White cranberry) Black Coffee (NO MILK/CREAM OR CREAMERS, sugar ok)  Clear Tea (NO MILK/CREAM OR CREAMERS, sugar ok) regular and decaf                             Plain Jell-O (NO RED)                                           Fruit ices (not with fruit pulp, NO RED)                                     Popsicles (NO RED)                                                               Sports drinks like Gatorade (NO RED)                   The day of surgery:  Drink ONE (1) Pre-Surgery Clear Ensure or G2 at  0815AM the morning of surgery. Drink in one sitting. Do not sip.  This drink was given to you during your hospital  pre-op appointment visit. Nothing else to drink after completing the  Pre-Surgery Clear Ensure or  G2.          If you have questions, please contact your surgeons office.   FOLLOW BOWEL PREP AND ANY ADDITIONAL PRE OP INSTRUCTIONS YOU RECEIVED FROM YOUR SURGEON'S OFFICE!!!     Oral Hygiene is also important to reduce your risk of infection.                                    Remember - BRUSH YOUR TEETH THE MORNING OF SURGERY WITH YOUR  REGULAR TOOTHPASTE  DENTURES WILL BE REMOVED PRIOR TO SURGERY PLEASE DO NOT APPLY Poly grip OR ADHESIVES!!!   Do NOT smoke after Midnight   Stop all vitamins and herbal supplements 7 days before surgery.   Take these medicines the morning of surgery with A SIP OF WATER :  coreg , nexium if needed, zocor               Metformin- none am of surgery   DO NOT TAKE ANY ORAL DIABETIC MEDICATIONS DAY OF YOUR SURGERY  Bring CPAP mask and tubing day of surgery.                              You may not have any metal on your body including hair pins, jewelry, and body piercing             Do not wear make-up, lotions, powders, perfumes/cologne, or deodorant  Do not wear nail polish including gel and S&S, artificial/acrylic nails, or any other type of covering on natural nails including finger and toenails. If you have artificial nails, gel coating, etc. that needs to be removed by a nail salon please have this removed prior to surgery or surgery may need to be canceled/ delayed if the surgeon/ anesthesia feels like they are unable to be safely monitored.   Do not shave  48 hours prior to surgery.               Men may shave face and neck.   Do not bring valuables to the hospital. [facility] IS NOT             RESPONSIBLE   FOR VALUABLES.   Contacts, glasses, dentures or bridgework may not be worn into surgery.   Bring small overnight bag day of surgery.   DO NOT BRING YOUR HOME MEDICATIONS TO THE HOSPITAL. PHARMACY WILL DISPENSE MEDICATIONS LISTED ON YOUR MEDICATION LIST TO YOU DURING YOUR ADMISSION IN THE HOSPITAL!    Patients discharged on the  day of surgery will not be allowed to drive home.  Someone NEEDS to stay with you for the first 24 hours after anesthesia.   Special Instructions: Bring a copy of your healthcare power of attorney and living will documents the day of surgery if you haven't scanned them before.              Please read over the following fact sheets you were given: IF YOU HAVE QUESTIONS ABOUT YOUR PRE-OP INSTRUCTIONS PLEASE CALL [phone].   If you received a COVID test during your pre-op visit  it is requested that you wear a mask when out in public, stay away from anyone that may not be feeling well and notify your surgeon if you develop symptoms. If you test positive for Covid or have been in contact with anyone that has tested positive in the last 10 days please notify you surgeon.      Pre-operative 4 CHG Bath Instructions   You can play a key role in reducing the risk of infection after surgery. Your skin needs to be as free of germs as possible. You can reduce the number of germs on your skin by washing with CHG (chlorhexidine  gluconate) soap before surgery. CHG is an antiseptic soap that kills germs and continues to kill germs even after washing.   DO NOT use if you have an allergy to chlorhexidine /CHG or antibacterial soaps. If your skin becomes reddened or irritated,  stop using the CHG and notify one of our RNs at [phone].   Please shower with the CHG soap starting 4 days before surgery using the following schedule:     Please keep in mind the following:  DO NOT shave, including legs and underarms, starting the day of your first shower.   You may shave your face at any point before/day of surgery.  Place clean sheets on your bed the day you start using CHG soap. Use a clean washcloth (not used since being washed) for each shower. DO NOT sleep with pets once you start using the CHG.   CHG Shower Instructions:  If you choose to wash your hair and private area, wash first with your normal  shampoo/soap.  After you use shampoo/soap, rinse your hair and body thoroughly to remove shampoo/soap residue.  Turn the water  OFF and apply about 3 tablespoons (45 ml) of CHG soap to a CLEAN washcloth.  Apply CHG soap ONLY FROM YOUR NECK DOWN TO YOUR TOES (washing for 3-5 minutes)  DO NOT use CHG soap on face, private areas, open wounds, or sores.  Pay special attention to the area where your surgery is being performed.  If you are having back surgery, having someone wash your back for you may be helpful. Wait 2 minutes after CHG soap is applied, then you may rinse off the CHG soap.  Pat dry with a clean towel  Put on clean clothes/pajamas   If you choose to wear lotion, please use ONLY the CHG-compatible lotions on the back of this paper.     Additional instructions for the day of surgery: DO NOT APPLY any lotions, deodorants, cologne, or perfumes.   Put on clean/comfortable clothes.  Brush your teeth.  Ask your nurse before applying any prescription medications to the skin.      CHG Compatible Lotions   Aveeno Moisturizing lotion  Cetaphil Moisturizing Cream  Cetaphil Moisturizing Lotion  Clairol Herbal Essence Moisturizing Lotion, Dry Skin  Clairol Herbal Essence Moisturizing Lotion, Extra Dry Skin  Clairol Herbal Essence Moisturizing Lotion, Normal Skin  Curel Age Defying Therapeutic Moisturizing Lotion with Alpha Hydroxy  Curel Extreme Care Body Lotion  Curel Soothing Hands Moisturizing Hand Lotion  Curel Therapeutic Moisturizing Cream, Fragrance-Free  Curel Therapeutic Moisturizing Lotion, Fragrance-Free  Curel Therapeutic Moisturizing Lotion, Original Formula  Eucerin Daily Replenishing Lotion  Eucerin Dry Skin Therapy Plus Alpha Hydroxy Crme  Eucerin Dry Skin Therapy Plus Alpha Hydroxy Lotion  Eucerin Original Crme  Eucerin Original Lotion  Eucerin Plus Crme Eucerin Plus Lotion  Eucerin TriLipid Replenishing Lotion  Keri Anti-Bacterial Hand Lotion  Keri Deep  Conditioning Original Lotion Dry Skin Formula Softly Scented  Keri Deep Conditioning Original Lotion, Fragrance Free Sensitive Skin Formula  Keri Lotion Fast Absorbing Fragrance Free Sensitive Skin Formula  Keri Lotion Fast Absorbing Softly Scented Dry Skin Formula  Keri Original Lotion  Keri Skin Renewal Lotion Keri Silky Smooth Lotion  Keri Silky Smooth Sensitive Skin Lotion  Nivea Body Creamy Conditioning Oil  Nivea Body Extra Enriched Teacher, Adult Education Moisturizing Lotion Nivea Crme  Nivea Skin Firming Lotion  NutraDerm 30 Skin Lotion  NutraDerm Skin Lotion  NutraDerm Therapeutic Skin Cream  NutraDerm Therapeutic Skin Lotion  ProShield Protective Hand Cream  Provon moisturizing lotion

## 2024-07-08 ENCOUNTER — Ambulatory Visit: Payer: Self-pay | Admitting: Student

## 2024-07-08 DIAGNOSIS — R7303 Prediabetes: Secondary | ICD-10-CM

## 2024-07-11 ENCOUNTER — Encounter (HOSPITAL_COMMUNITY): Admission: RE | Admit: 2024-07-11

## 2024-07-20 ENCOUNTER — Ambulatory Visit (HOSPITAL_COMMUNITY): Admit: 2024-07-20 | Admitting: Orthopedic Surgery

## 2024-10-31 ENCOUNTER — Encounter

## 2024-10-31 ENCOUNTER — Ambulatory Visit
